# Patient Record
Sex: Male | Born: 1975
Health system: Southern US, Community
[De-identification: ages and names within clinical notes are randomized; demographics above are authoritative.]

## PROBLEM LIST (undated history)

## (undated) DIAGNOSIS — K589 Irritable bowel syndrome without diarrhea: Secondary | ICD-10-CM

## (undated) DIAGNOSIS — M545 Low back pain, unspecified: Secondary | ICD-10-CM

## (undated) DIAGNOSIS — K469 Unspecified abdominal hernia without obstruction or gangrene: Secondary | ICD-10-CM

## (undated) DIAGNOSIS — Z720 Tobacco use: Secondary | ICD-10-CM

## (undated) DIAGNOSIS — F959 Tic disorder, unspecified: Secondary | ICD-10-CM

## (undated) DIAGNOSIS — F419 Anxiety disorder, unspecified: Secondary | ICD-10-CM

## (undated) DIAGNOSIS — F329 Major depressive disorder, single episode, unspecified: Secondary | ICD-10-CM

## (undated) DIAGNOSIS — L409 Psoriasis, unspecified: Secondary | ICD-10-CM

## (undated) DIAGNOSIS — K219 Gastro-esophageal reflux disease without esophagitis: Secondary | ICD-10-CM

## (undated) DIAGNOSIS — F32A Depression, unspecified: Secondary | ICD-10-CM

## (undated) HISTORY — DX: Tobacco use: Z72.0

## (undated) HISTORY — DX: Psoriasis, unspecified: L40.9

## (undated) HISTORY — PX: EYE SURGERY: SHX253

## (undated) HISTORY — DX: Irritable bowel syndrome, unspecified: K58.9

## (undated) HISTORY — DX: Major depressive disorder, single episode, unspecified: F32.9

## (undated) HISTORY — DX: Tic disorder, unspecified: F95.9

## (undated) HISTORY — PX: COLONOSCOPY: SHX174

## (undated) HISTORY — DX: Low back pain: M54.5

## (undated) HISTORY — DX: Low back pain, unspecified: M54.50

## (undated) HISTORY — DX: Depression, unspecified: F32.A

## (undated) HISTORY — DX: Anxiety disorder, unspecified: F41.9

## (undated) HISTORY — DX: Unspecified abdominal hernia without obstruction or gangrene: K46.9

## (undated) HISTORY — DX: Gastro-esophageal reflux disease without esophagitis: K21.9

---

## 1997-08-17 ENCOUNTER — Ambulatory Visit (HOSPITAL_COMMUNITY): Admission: RE | Admit: 1997-08-17 | Discharge: 1997-08-17 | Payer: Self-pay | Admitting: Gastroenterology

## 1998-01-20 ENCOUNTER — Emergency Department (HOSPITAL_COMMUNITY): Admission: EM | Admit: 1998-01-20 | Discharge: 1998-01-20 | Payer: Self-pay | Admitting: Emergency Medicine

## 1998-04-25 ENCOUNTER — Encounter: Admission: RE | Admit: 1998-04-25 | Discharge: 1998-04-25 | Payer: Self-pay | Admitting: *Deleted

## 1998-10-19 ENCOUNTER — Emergency Department (HOSPITAL_COMMUNITY): Admission: EM | Admit: 1998-10-19 | Discharge: 1998-10-19 | Payer: Self-pay | Admitting: *Deleted

## 2000-05-22 ENCOUNTER — Emergency Department (HOSPITAL_COMMUNITY): Admission: EM | Admit: 2000-05-22 | Discharge: 2000-05-23 | Payer: Self-pay | Admitting: Emergency Medicine

## 2003-12-06 ENCOUNTER — Ambulatory Visit: Payer: Self-pay | Admitting: Internal Medicine

## 2005-01-29 ENCOUNTER — Ambulatory Visit: Payer: Self-pay | Admitting: Internal Medicine

## 2006-03-18 ENCOUNTER — Ambulatory Visit: Payer: Self-pay | Admitting: Internal Medicine

## 2006-04-20 ENCOUNTER — Encounter: Payer: Self-pay | Admitting: Internal Medicine

## 2008-01-21 HISTORY — PX: ESOPHAGOGASTRODUODENOSCOPY: SHX1529

## 2008-01-26 DIAGNOSIS — F341 Dysthymic disorder: Secondary | ICD-10-CM | POA: Insufficient documentation

## 2008-01-26 DIAGNOSIS — J309 Allergic rhinitis, unspecified: Secondary | ICD-10-CM | POA: Insufficient documentation

## 2008-01-28 ENCOUNTER — Ambulatory Visit: Payer: Self-pay | Admitting: Internal Medicine

## 2008-01-28 DIAGNOSIS — M542 Cervicalgia: Secondary | ICD-10-CM | POA: Insufficient documentation

## 2008-02-02 ENCOUNTER — Ambulatory Visit: Payer: Self-pay | Admitting: Internal Medicine

## 2008-02-02 ENCOUNTER — Encounter: Payer: Self-pay | Admitting: Internal Medicine

## 2008-10-27 ENCOUNTER — Encounter: Payer: Self-pay | Admitting: Internal Medicine

## 2009-12-12 ENCOUNTER — Emergency Department (HOSPITAL_COMMUNITY): Admission: EM | Admit: 2009-12-12 | Discharge: 2009-12-12 | Payer: Self-pay | Admitting: Emergency Medicine

## 2010-04-02 LAB — ETHANOL: Alcohol, Ethyl (B): 203 mg/dL — ABNORMAL HIGH (ref 0–10)

## 2010-06-07 NOTE — Assessment & Plan Note (Signed)
[facility]                         GASTROENTEROLOGY OFFICE NOTE   Clarence Mcgee, Clarence Mcgee                       MRN:          [id number]  DATE:03/18/2006                            DOB:          10/29/1975    CHIEF COMPLAINT:  Neck and throat pain, question reflux.   Clarence Mcgee is referred back by [facility] because of throat and neck  pain.   HISTORY:  The patient has a feeling like there is a tightness in his  throat, sometimes a burning or tearing pain.  He has really had a  problem with this for some time.  He relates the initial problem going  back to about 10 years ago when he ran into a fence while playing  softball and struck his neck.  He did have an ER evaluation at that  time, but cannot recall the details.  He feels like he has a sensitive  gag reflex.  There is no obvious dysphagia.  He feels like proton pump  inhibitors help this burning sensation, but do not completely relieve  it.  In the past, he had been on Nexium, but felt that that had made him  gain weight.  He does have chronic sinus and allergy problems, though he  feels like that has only been recently flaring up with drainage.  He is  having difficulty sleeping due to snoring.  He does work third shift.  Another problem is a chronic history of facial tics, and he says these  involve his neck.  When he was in the ninth grade, he went to a  neurologist and was given some medicine that made him sleepy.  He took  that for 2 days, and he never sought follow up.  He does not seem to  have other syndromes that go along with facial tics that I am aware of.  He is not losing weight.  He is still complaining about stretch marks  that were on his abdomen.  He is using some sort of cream that seems to  be helping those fade.  In the past, he had been on Celexa for the  pressor symptomatology.  He has stopped that.  He said that that made  concentration difficult.  Since his cousin moved  in with him and he has  somebody to talk to he says, he has less depression and anxiety  problems.   PAST MEDICAL HISTORY:  As noted above.  He has previously had an  endoscopy in [facility] a number of years ago that showed a hiatal  hernia.   MEDICATIONS:  Prevacid 30 mg daily.   DRUG ALLERGIES:  None known.   PHYSICAL EXAMINATION:  GENERAL:  A well-developed young white man in no  acute distress.  VITAL SIGNS:  Weight 206 pounds, pulse 68, blood pressure 118/70.  HEENT:  The eyes are anicteric.  He has mild erythema in the posterior  pharynx with some slight cobblestoning.  The tongue is normal.  The oral  cavity is otherwise normal.  NECK:  He has a mildly tender neck.  There is slight  fullness there.  I  think that is just the size of his neck.  I do not think he has  thyromegaly.  There is no significant pain upon rotation of the neck or  flexion or extension.   ASSESSMENT:  1. I think he probably does have some gastroesophageal reflux disease,      but I am not convinced it is responsible for all this neck and      throat pain.  That could be related to his tics.  It could be sinus      drainage.  It could be a combination of all three.  2. He is a smoker and is counseled to quit.  He said his tics      increased significantly when he tried to quit before.  3. Snoring.  Question sleep disturbance.  Question sleep apnea.   PLAN:  1. Trial of Prevacid twice daily.  I have explained that it will take      2-3 months to see if this will help these throat symptoms.  He will      return in 2 months.  I have given him samples to supplement his      daily prescription.  2. Gastroesophageal reflux disease.  Lifestyle diet sheet given.  3. He is counseled to quit smoking.  4. We will refer him to neurology for evaluation of his tics.  5. He may need an ear, nose and throat evaluation, as well.  Will see      how he responds to the Prevacid first.   NOTE:  I have reviewed [facility]'s office notes and records.  He had a  normal CMET and TSH on September 05, 2006.  He had a normal CBC on the same  date.     Iva Boop, MD,FACG  Electronically Signed    CEG/MedQ  DD: 03/18/2006  DT: 03/18/2006  Job #: [id number]   cc:   [facility]

## 2012-09-17 ENCOUNTER — Encounter: Payer: Self-pay | Admitting: Neurology

## 2012-09-17 ENCOUNTER — Ambulatory Visit (INDEPENDENT_AMBULATORY_CARE_PROVIDER_SITE_OTHER): Payer: BC Managed Care – PPO | Admitting: Neurology

## 2012-09-17 VITALS — BP 103/62 | HR 69 | Ht 67.0 in | Wt 198.0 lb

## 2012-09-17 DIAGNOSIS — Z720 Tobacco use: Secondary | ICD-10-CM

## 2012-09-17 DIAGNOSIS — F959 Tic disorder, unspecified: Secondary | ICD-10-CM | POA: Insufficient documentation

## 2012-09-17 DIAGNOSIS — M545 Low back pain, unspecified: Secondary | ICD-10-CM

## 2012-09-17 DIAGNOSIS — G2569 Other tics of organic origin: Secondary | ICD-10-CM

## 2012-09-17 DIAGNOSIS — F341 Dysthymic disorder: Secondary | ICD-10-CM

## 2012-09-17 DIAGNOSIS — K469 Unspecified abdominal hernia without obstruction or gangrene: Secondary | ICD-10-CM

## 2012-09-17 MED ORDER — CLONAZEPAM 0.5 MG PO TABS
0.5000 mg | ORAL_TABLET | Freq: Two times a day (BID) | ORAL | Status: DC | PRN
Start: 2012-09-17 — End: 2017-09-10

## 2012-09-17 NOTE — Progress Notes (Signed)
[facility]  PATIENT: Clarence Mcgee DOB: [birth year]  HISTORICAL Clarence Mcgee is a 37 years old right-handed Caucasian male, referred by his primary care Marisue Brooklyn for evaluation of tic disorder,  He had a history of tic disorder as long as he could remember, over the years, it has changed its pattern, he had frequent eye blinking, later on jerking movement of his neck, sometimes squeezing his throat muscles, to the point of he felt fatigued in his neck, he was seen by our clinic in 2010, he also has depression anxiety, social phobia, he has tried different medications in the past, including Paxil, Celexa, Lexapro, Prozac, which has caused worsening anxiety, he responded very well to low-dose clonazepam 0.5 mg twice a day, however due to difficulty getting the refill, he has stopped the clonazepam about 4 years ago, there was no significant withdrawal  He is smoking half pack to one pack a day, working as a Geologist, engineering, he has mild OCD tendency, such as cleaning his time clock 3 times, before he key in, he also continued to have mild to moderate tics, adjusting his pants repetitively, eye blinking, squeeze his neck muscles, he continued to suffer social anxiety, he even feels nervous when talking with a stranger on the phone, he wants to quite smoking, but his anxiety associated with it, has made his symptoms much worse, he wants to go back to clonazepam again.   REVIEW OF SYSTEMS: Full 14 system review of systems performed and notable only for snoring, depression,  ALLERGIES: No Known Allergies  HOME MEDICATIONS: Nexium  PAST MEDICAL HISTORY: Past Medical History  Diagnosis Date  . GERD (gastroesophageal reflux disease)   . Low back pain   . Hernia   . Tobacco use   . Tic     PAST SURGICAL HISTORY: Past Surgical History  Procedure Laterality Date  . None      FAMILY HISTORY: Family History  Problem Relation Age of Onset  . Diabetes Mother   .  Fibromyalgia Mother   . Diabetes Father     SOCIAL HISTORY:  History   Social History  . Marital Status: Divorced    Spouse Name: N/A    Number of Children: 2  . 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 Smoking status: Current Every Day Smoker -- 1.00 packs/day for 10 years    Types: Cigarettes  . Smokeless tobacco: Never Used  . Alcohol Use: 0.6 oz/week    1 Cans of beer per week     Comment: Sometimes  . Drug Use: No  . Sexual Activity: Not on file    Social History Narrative   Patient lives at home alone. Patient works at [employer] full time. Patient has some college education.   Caffeine- two daily   Right handed.     PHYSICAL EXAM  Filed Vitals:   09/17/12 0814  BP: 103/62  Pulse: 69  Height: 5\' 7"  (1.702 m)  Weight: 198 lb (89.812 kg)    Body mass index is 31 kg/(m^2).   Generalized: In no acute distress  Neck: Supple, no carotid bruits   Cardiac: Regular rate rhythm  Pulmonary: Clear to auscultation bilaterally  Musculoskeletal: No deformity  Neurological examination  Mentation: anxious looking young male, alert oriented to time, place, history taking, and causual conversation  Cranial nerve II-XII: Pupils were equal round reactive to  light extraocular movements were full, visual field were full on confrontational test. facial sensation and strength were normal. hearing was intact to finger rubbing bilaterally. Uvula tongue midline.  head turning and shoulder shrug and were normal and symmetric.Tongue protrusion into cheek strength was normal.  Motor: normal tone, bulk and strength.  Sensory: Intact to fine touch, pinprick, preserved vibratory sensation, and proprioception at toes.  Coordination: Normal finger to nose, heel-to-shin bilaterally there was no truncal ataxia  Gait: Rising up from seated position without assistance, normal stance, without  trunk ataxia, moderate stride, good arm swing, smooth turning, able to perform tiptoe, and heel walking without difficulty.   Romberg signs: Negative  Deep tendon reflexes: Brachioradialis 2/2, biceps 2/2, triceps 2/2, patellar 2/2, Achilles 2/2, plantar responses were flexor bilaterally.   DIAGNOSTIC DATA (LABS, IMAGING, TESTING) - I reviewed patient records, labs, notes, testing and imaging myself where available.    ASSESSMENT AND PLAN 37 years old right-handed Caucasian male, with history of tic disorder, failed multiple SSRI in the past, responded very well to low-dose clonazepam,  1 I will reveal clonazepam 0 point 5 mg twice a day 2. return to clinic in 5 months with Eber Jones.  Levert Feinstein, M.D. Ph.D.  [facility] [address] [place], [place] [address] [phone]

## 2013-01-11 ENCOUNTER — Emergency Department (HOSPITAL_BASED_OUTPATIENT_CLINIC_OR_DEPARTMENT_OTHER): Payer: 59

## 2013-01-11 ENCOUNTER — Emergency Department (HOSPITAL_BASED_OUTPATIENT_CLINIC_OR_DEPARTMENT_OTHER)
Admission: EM | Admit: 2013-01-11 | Discharge: 2013-01-11 | Disposition: A | Discharge status: Home / Self Care | Payer: 59 | Attending: Emergency Medicine | Admitting: Emergency Medicine

## 2013-01-11 ENCOUNTER — Encounter (HOSPITAL_BASED_OUTPATIENT_CLINIC_OR_DEPARTMENT_OTHER): Payer: Self-pay | Admitting: Emergency Medicine

## 2013-01-11 DIAGNOSIS — K219 Gastro-esophageal reflux disease without esophagitis: Secondary | ICD-10-CM | POA: Insufficient documentation

## 2013-01-11 DIAGNOSIS — R0602 Shortness of breath: Secondary | ICD-10-CM | POA: Insufficient documentation

## 2013-01-11 DIAGNOSIS — F17200 Nicotine dependence, unspecified, uncomplicated: Secondary | ICD-10-CM | POA: Insufficient documentation

## 2013-01-11 DIAGNOSIS — Z79899 Other long term (current) drug therapy: Secondary | ICD-10-CM | POA: Insufficient documentation

## 2013-01-11 DIAGNOSIS — IMO0001 Reserved for inherently not codable concepts without codable children: Secondary | ICD-10-CM | POA: Insufficient documentation

## 2013-01-11 DIAGNOSIS — B9789 Other viral agents as the cause of diseases classified elsewhere: Secondary | ICD-10-CM | POA: Insufficient documentation

## 2013-01-11 DIAGNOSIS — B349 Viral infection, unspecified: Secondary | ICD-10-CM

## 2013-01-11 DIAGNOSIS — R51 Headache: Secondary | ICD-10-CM | POA: Insufficient documentation

## 2013-01-11 MED ORDER — ONDANSETRON 4 MG PO TBDP
4.0000 mg | ORAL_TABLET | Freq: Once | ORAL | Status: DC
  Filled 2013-01-11: qty 1

## 2013-01-11 MED ORDER — BENZONATATE 100 MG PO CAPS: 100.0000 mg | ORAL_CAPSULE | Freq: Three times a day (TID) | ORAL | Status: DC

## 2013-01-11 MED ORDER — ONDANSETRON 4 MG PO TBDP
4.0000 mg | ORAL_TABLET | Freq: Once | ORAL | Status: AC
  Administered 2013-01-11: 4 mg via ORAL

## 2013-01-11 MED ORDER — ACETAMINOPHEN 325 MG PO TABS
650.0000 mg | ORAL_TABLET | Freq: Once | ORAL | Status: AC
  Administered 2013-01-11: 650 mg via ORAL
  Filled 2013-01-11: qty 2

## 2013-01-11 MED ORDER — IBUPROFEN 800 MG PO TABS: 800.0000 mg | ORAL_TABLET | Freq: Three times a day (TID) | ORAL | Status: DC

## 2013-01-11 NOTE — ED Notes (Signed)
Pt reports 4 days ago with cough, congestion and sinus pressure.  Fever.  Today with vomiting, cough induced.

## 2013-01-11 NOTE — ED Provider Notes (Signed)
CSN: [id number]     Arrival date & time 01/11/13  1651 History   None    Chief Complaint  Patient presents with  . Fever   (Consider location/radiation/quality/duration/timing/severity/associated sxs/prior Treatment) Patient is a 37 y.o. male presenting with cough. The history is provided by the patient. No language interpreter was used.  Cough Cough characteristics:  Productive Severity:  Moderate Onset quality:  Gradual Duration:  3 days Timing:  Constant Progression:  Worsening Chronicity:  New Smoker: no   Context: sick contacts   Relieved by:  Nothing Worsened by:  Nothing tried Ineffective treatments:  None tried Associated symptoms: chills, fever, headaches, myalgias, shortness of breath, sinus congestion and sore throat     Past Medical History  Diagnosis Date  . GERD (gastroesophageal reflux disease)   . Low back pain   . Hernia   . Tobacco use   . Tic    Past Surgical History  Procedure Laterality Date  . None     Family History  Problem Relation Age of Onset  . Diabetes Mother   . Fibromyalgia Mother   . Diabetes Father    History  Substance Use Topics  . Smoking status: Current Every Day Smoker -- 1.00 packs/day for 10 years    Types: Cigarettes  . Smokeless tobacco: Never Used  . Alcohol Use: 0.6 oz/week    1 Cans of beer per week     Comment: Sometimes    Review of Systems  Constitutional: Positive for fever and chills.  HENT: Positive for sore throat.   Respiratory: Positive for cough and shortness of breath.   Musculoskeletal: Positive for myalgias.  Neurological: Positive for headaches.  All other systems reviewed and are negative.    Allergies  Review of patient's allergies indicates no known allergies.  Home Medications   Current Outpatient Rx  Name  Route  Sig  Dispense  Refill  . clobetasol cream (TEMOVATE) 0.05 %      0.05 application as directed.         . clonazePAM (KLONOPIN) 0.5 MG tablet   Oral   Take 1 tablet  (0.5 mg total) by mouth 2 (two) times daily as needed for anxiety.   60 tablet   5   . NEXIUM 40 MG capsule      40 mg daily.          BP 115/63  Pulse 94  Temp(Src) 100 F (37.8 C) (Oral)  Resp 22  Ht 5' 8.5" (1.74 m)  Wt 200 lb (90.719 kg)  BMI 29.96 kg/m2  SpO2 98% Physical Exam  Nursing note and vitals reviewed. Constitutional: He appears well-developed and well-nourished.  HENT:  Head: Normocephalic and atraumatic.  Eyes: Conjunctivae and EOM are normal. Pupils are equal, round, and reactive to light.  Neck: Normal range of motion. Neck supple.  Cardiovascular: Normal rate.   Pulmonary/Chest: Effort normal and breath sounds normal. He has no wheezes. He has no rales. He exhibits no tenderness.  Abdominal: Soft.  Musculoskeletal: Normal range of motion.  Neurological: He is alert.  Skin: Skin is warm.    ED Course  Procedures (including critical care time) Labs Review Labs Reviewed - No data to display Imaging Review No results found.  EKG Interpretation   None       MDM   1. Viral illness    Chest xray no pneumonia.  Pt given tylenol.  zofran  Po fluids encouraged.  Pt given rx for ibuprofen and tessalon  Lonia Skinner [facility], PA-C 01/11/13 2313

## 2013-01-11 NOTE — ED Notes (Signed)
Pt complains of body aches and fever.  Pt reports cough that caused him to vomit today.

## 2013-01-12 NOTE — ED Provider Notes (Signed)
Medical screening examination/treatment/procedure(s) were performed by non-physician practitioner and as supervising physician I was immediately available for consultation/collaboration.  EKG Interpretation   None         Junius Argyle, MD 01/12/13 1045

## 2013-02-17 ENCOUNTER — Ambulatory Visit: Payer: BC Managed Care – PPO | Admitting: Nurse Practitioner

## 2014-06-20 ENCOUNTER — Encounter: Payer: Self-pay | Admitting: Internal Medicine

## 2014-07-18 ENCOUNTER — Emergency Department (HOSPITAL_COMMUNITY): Payer: BLUE CROSS/BLUE SHIELD

## 2014-07-18 ENCOUNTER — Encounter (HOSPITAL_COMMUNITY): Payer: Self-pay | Admitting: Emergency Medicine

## 2014-07-18 ENCOUNTER — Emergency Department (HOSPITAL_COMMUNITY)
Admission: EM | Admit: 2014-07-18 | Discharge: 2014-07-18 | Disposition: A | Discharge status: Home / Self Care | Payer: BLUE CROSS/BLUE SHIELD | Attending: Emergency Medicine | Admitting: Emergency Medicine

## 2014-07-18 DIAGNOSIS — K219 Gastro-esophageal reflux disease without esophagitis: Secondary | ICD-10-CM | POA: Diagnosis not present

## 2014-07-18 DIAGNOSIS — Z791 Long term (current) use of non-steroidal anti-inflammatories (NSAID): Secondary | ICD-10-CM | POA: Diagnosis not present

## 2014-07-18 DIAGNOSIS — R112 Nausea with vomiting, unspecified: Secondary | ICD-10-CM | POA: Insufficient documentation

## 2014-07-18 DIAGNOSIS — J029 Acute pharyngitis, unspecified: Secondary | ICD-10-CM | POA: Diagnosis not present

## 2014-07-18 DIAGNOSIS — Z720 Tobacco use: Secondary | ICD-10-CM | POA: Insufficient documentation

## 2014-07-18 DIAGNOSIS — R509 Fever, unspecified: Secondary | ICD-10-CM | POA: Diagnosis present

## 2014-07-18 DIAGNOSIS — M791 Myalgia: Secondary | ICD-10-CM | POA: Diagnosis not present

## 2014-07-18 DIAGNOSIS — R05 Cough: Secondary | ICD-10-CM | POA: Insufficient documentation

## 2014-07-18 DIAGNOSIS — R5383 Other fatigue: Secondary | ICD-10-CM | POA: Insufficient documentation

## 2014-07-18 DIAGNOSIS — R059 Cough, unspecified: Secondary | ICD-10-CM

## 2014-07-18 LAB — RAPID STREP SCREEN (MED CTR MEBANE ONLY): Streptococcus, Group A Screen (Direct): POSITIVE — AB

## 2014-07-18 MED ORDER — PENICILLIN G BENZATHINE 1200000 UNIT/2ML IM SUSP
2.4000 10*6.[IU] | Freq: Once | INTRAMUSCULAR | Status: AC
  Administered 2014-07-18: 2.4 10*6.[IU] via INTRAMUSCULAR
  Filled 2014-07-18: qty 4

## 2014-07-18 MED ORDER — ONDANSETRON 4 MG PO TBDP: 4.0000 mg | ORAL_TABLET | Freq: Three times a day (TID) | ORAL | Status: DC | PRN

## 2014-07-18 MED ORDER — ONDANSETRON 4 MG PO TBDP
4.0000 mg | ORAL_TABLET | Freq: Once | ORAL | Status: AC
  Administered 2014-07-18: 4 mg via ORAL
  Filled 2014-07-18: qty 1

## 2014-07-18 NOTE — ED Provider Notes (Signed)
CSN: [id number]     Arrival date & time 07/18/14  0712 History   First MD Initiated Contact with Patient 07/18/14 0719     Chief Complaint  Patient presents with  . Fever      HPI  Condition returns for evaluation of fever sore throat and body aches. Symptoms last 24 hours. Taking Mucinex at home for fever. Has not taken Motrin follow-up. Vomiting this morning 2. Presents here.  Nonproductive cough. No urinary symptoms. No diarrhea.  Past Medical History  Diagnosis Date  . GERD (gastroesophageal reflux disease)   . Low back pain   . Hernia   . Tobacco use   . Tic    Past Surgical History  Procedure Laterality Date  . None     Family History  Problem Relation Age of Onset  . Diabetes Mother   . Fibromyalgia Mother   . Diabetes Father    History  Substance Use Topics  . Smoking status: Current Every Day Smoker -- 1.00 packs/day for 10 years    Types: Cigarettes  . Smokeless tobacco: Never Used  . Alcohol Use: 0.6 oz/week    1 Cans of beer per week     Comment: Sometimes    Review of Systems  Constitutional: Positive for fever and fatigue. Negative for chills, diaphoresis and appetite change.  HENT: Positive for sore throat. Negative for mouth sores and trouble swallowing.   Eyes: Negative for visual disturbance.  Respiratory: Positive for cough. Negative for chest tightness, shortness of breath and wheezing.   Cardiovascular: Negative for chest pain.  Gastrointestinal: Positive for nausea and vomiting. Negative for abdominal pain, diarrhea and abdominal distention.  Endocrine: Negative for polydipsia, polyphagia and polyuria.  Genitourinary: Negative for dysuria, frequency and hematuria.  Musculoskeletal: Positive for myalgias. Negative for gait problem.  Skin: Negative for color change, pallor and rash.  Neurological: Negative for dizziness, syncope, light-headedness and headaches.  Hematological: Does not bruise/bleed easily.  Psychiatric/Behavioral: Negative  for behavioral problems and confusion.      Allergies  Review of patient's allergies indicates no known allergies.  Home Medications   Prior to Admission medications   Medication Sig Start Date End Date Taking? Authorizing Provider  benzonatate (TESSALON) 100 MG capsule Take 1 capsule (100 mg total) by mouth every 8 (eight) hours. 01/11/13   Elson AreasLeslie K Sofia, PA-C  clobetasol cream (TEMOVATE) 0.05 % 0.05 application as directed. 08/25/12   Historical Provider, MD  clonazePAM (KLONOPIN) 0.5 MG tablet Take 1 tablet (0.5 mg total) by mouth 2 (two) times daily as needed for anxiety. 09/17/12   Levert FeinsteinYijun Yan, MD  ibuprofen (ADVIL,MOTRIN) 800 MG tablet Take 1 tablet (800 mg total) by mouth 3 (three) times daily. 01/11/13   Lonia SkinnerLeslie K Sofia, PA-C  NEXIUM 40 MG capsule 40 mg daily. 08/25/12   Historical Provider, MD  ondansetron (ZOFRAN ODT) 4 MG disintegrating tablet Take 1 tablet (4 mg total) by mouth every 8 (eight) hours as needed for nausea. 07/18/14   Rolland PorterMark Maggy Wyble, MD   BP 124/76 mmHg  Pulse 100  Temp(Src) 99.2 F (37.3 C) (Oral)  Resp 20  SpO2 96% Physical Exam  Constitutional: He is oriented to person, place, and time. He appears well-developed and well-nourished. No distress.  HENT:  Head: Macrocephalic.  Erythematous pharynx. Not classic appearance for strep no exudate no petechiae.  Eyes: Conjunctivae are normal. Pupils are equal, round, and reactive to light. No scleral icterus.  Neck: Normal range of motion. Neck supple. No thyromegaly present.  No adenopathy of the neck. .  Cardiovascular: Normal rate and regular rhythm.  Exam reveals no gallop and no friction rub.   No murmur heard. Pulmonary/Chest: Effort normal and breath sounds normal. No respiratory distress. He has no wheezes. He has no rales.  Clear lungs. No adventitial breath sounds.  Abdominal: Soft. Bowel sounds are normal. He exhibits no distension. There is no tenderness. There is no rebound.  Musculoskeletal: Normal range  of motion.  Neurological: He is alert and oriented to person, place, and time.  Skin: Skin is warm and dry. No rash noted.  Psychiatric: He has a normal mood and affect. His behavior is normal.    ED Course  Procedures (including critical care time) Labs Review Labs Reviewed  RAPID STREP SCREEN (NOT AT [facility]) - Abnormal; Notable for the following:    Streptococcus, Group A Screen (Direct) POSITIVE (*)    All other components within normal limits    Imaging Review Dg Chest 2 View  07/18/2014   CLINICAL DATA:  Cough.  EXAM: CHEST  2 VIEW  COMPARISON:  January 11, 2013.  FINDINGS: The heart size and mediastinal contours are within normal limits. Both lungs are clear. No pneumothorax or pleural effusion is noted. The visualized skeletal structures are unremarkable.  IMPRESSION: No active cardiopulmonary disease.   Electronically Signed   By: Lupita Raider, M.D.   On: 07/18/2014 07:55     EKG Interpretation None      MDM   Final diagnoses:  Cough  Pharyngitis    Causes strep. Given IM Bicillin. By mouth Zofran. Taking by mouth liquids. Plan is home, rest, push fluids, Motrin or Tylenol when necessary.    Rolland Porter, MD 07/18/14 [phone]

## 2014-07-18 NOTE — ED Notes (Addendum)
Pt reports taking mucinex at 0600; denies taking anything else for symptoms.

## 2014-07-18 NOTE — Discharge Instructions (Signed)
Zofran as needed for nausea or vomiting. Rest, push fluids. Return or Tylenol as needed for fever or body aches.   Fever, Adult A fever is a temperature of 100.4 F (38 C) or above.  HOME CARE  Take fever medicine as told by your doctor. Do not  take aspirin for fever if you are younger than 41 years of age.  If you are given antibiotic medicine, take it as told. Finish the medicine even if you start to feel better.  Rest.  Drink enough fluids to keep your pee (urine) clear or pale yellow. Do not drink alcohol.  Take a bath or shower with room temperature water. Do not use ice water or alcohol sponge baths.  Wear lightweight, loose clothes. GET HELP RIGHT AWAY IF:   You are short of breath or have trouble breathing.  You are very weak.  You are dizzy or you pass out (faint).  You are very thirsty or are making little or no urine.  You have new pain.  You throw up (vomit) or have watery poop (diarrhea).  You keep throwing up or having watery poop for more than 1 to 2 days.  You have a stiff neck or light bothers your eyes.  You have a skin rash.  You have a fever or problems (symptoms) that last for more than 2 to 3 days.  You have a fever and your problems quickly get worse.  You keep throwing up the fluids you drink.  You do not feel better after 3 days.  You have new problems. MAKE SURE YOU:   Understand these instructions.  Will watch your condition.  Will get help right away if you are not doing well or get worse. Document Released: 10/16/2007 Document Revised: 03/31/2011 Document Reviewed: 11/07/2010 [facility] EdwardsExitCare Patient Information 2015 [place], [place]. This information is not intended to replace advice given to you by your health care provider. Make sure you discuss any questions you have with your health care provider.  Pharyngitis Pharyngitis is a sore throat (pharynx). There is redness, pain, and swelling of your throat. HOME CARE   Drink enough  fluids to keep your pee (urine) clear or pale yellow.  Only take medicine as told by your doctor.  You may get sick again if you do not take medicine as told. Finish your medicines, even if you start to feel better.  Do not take aspirin.  Rest.  Rinse your mouth (gargle) with salt water ( tsp of salt per 1 qt of water) every 1-2 hours. This will help the pain.  If you are not at risk for choking, you can suck on hard candy or sore throat lozenges. GET HELP IF:  You have large, tender lumps on your neck.  You have a rash.  You cough up green, yellow-brown, or bloody spit. GET HELP RIGHT AWAY IF:   You have a stiff neck.  You drool or cannot swallow liquids.  You throw up (vomit) or are not able to keep medicine or liquids down.  You have very bad pain that does not go away with medicine.  You have problems breathing (not from a stuffy nose). MAKE SURE YOU:   Understand these instructions.  Will watch your condition.  Will get help right away if you are not doing well or get worse. Document Released: 06/25/2007 Document Revised: 10/27/2012 Document Reviewed: 09/13/2012 [facility]Care Patient Information 2015 [place], [place]. This information is not intended to replace advice given to you by your health care  provider. Make sure you discuss any questions you have with your health care provider. ° °

## 2014-07-18 NOTE — ED Notes (Signed)
Pt transported to DG at present time; will administer nausea medication with pt return.

## 2014-07-18 NOTE — ED Notes (Signed)
Per pt, states fever and cold symptoms for 2 days-no relief with OTC meds

## 2015-04-21 IMAGING — CR DG CHEST 2V
2 series · 2 of 2 positions shown · non-contrast
Comparison: None.

CLINICAL DATA: Cough and fever.

EXAM:
CHEST  2 VIEW

[w chest pa]
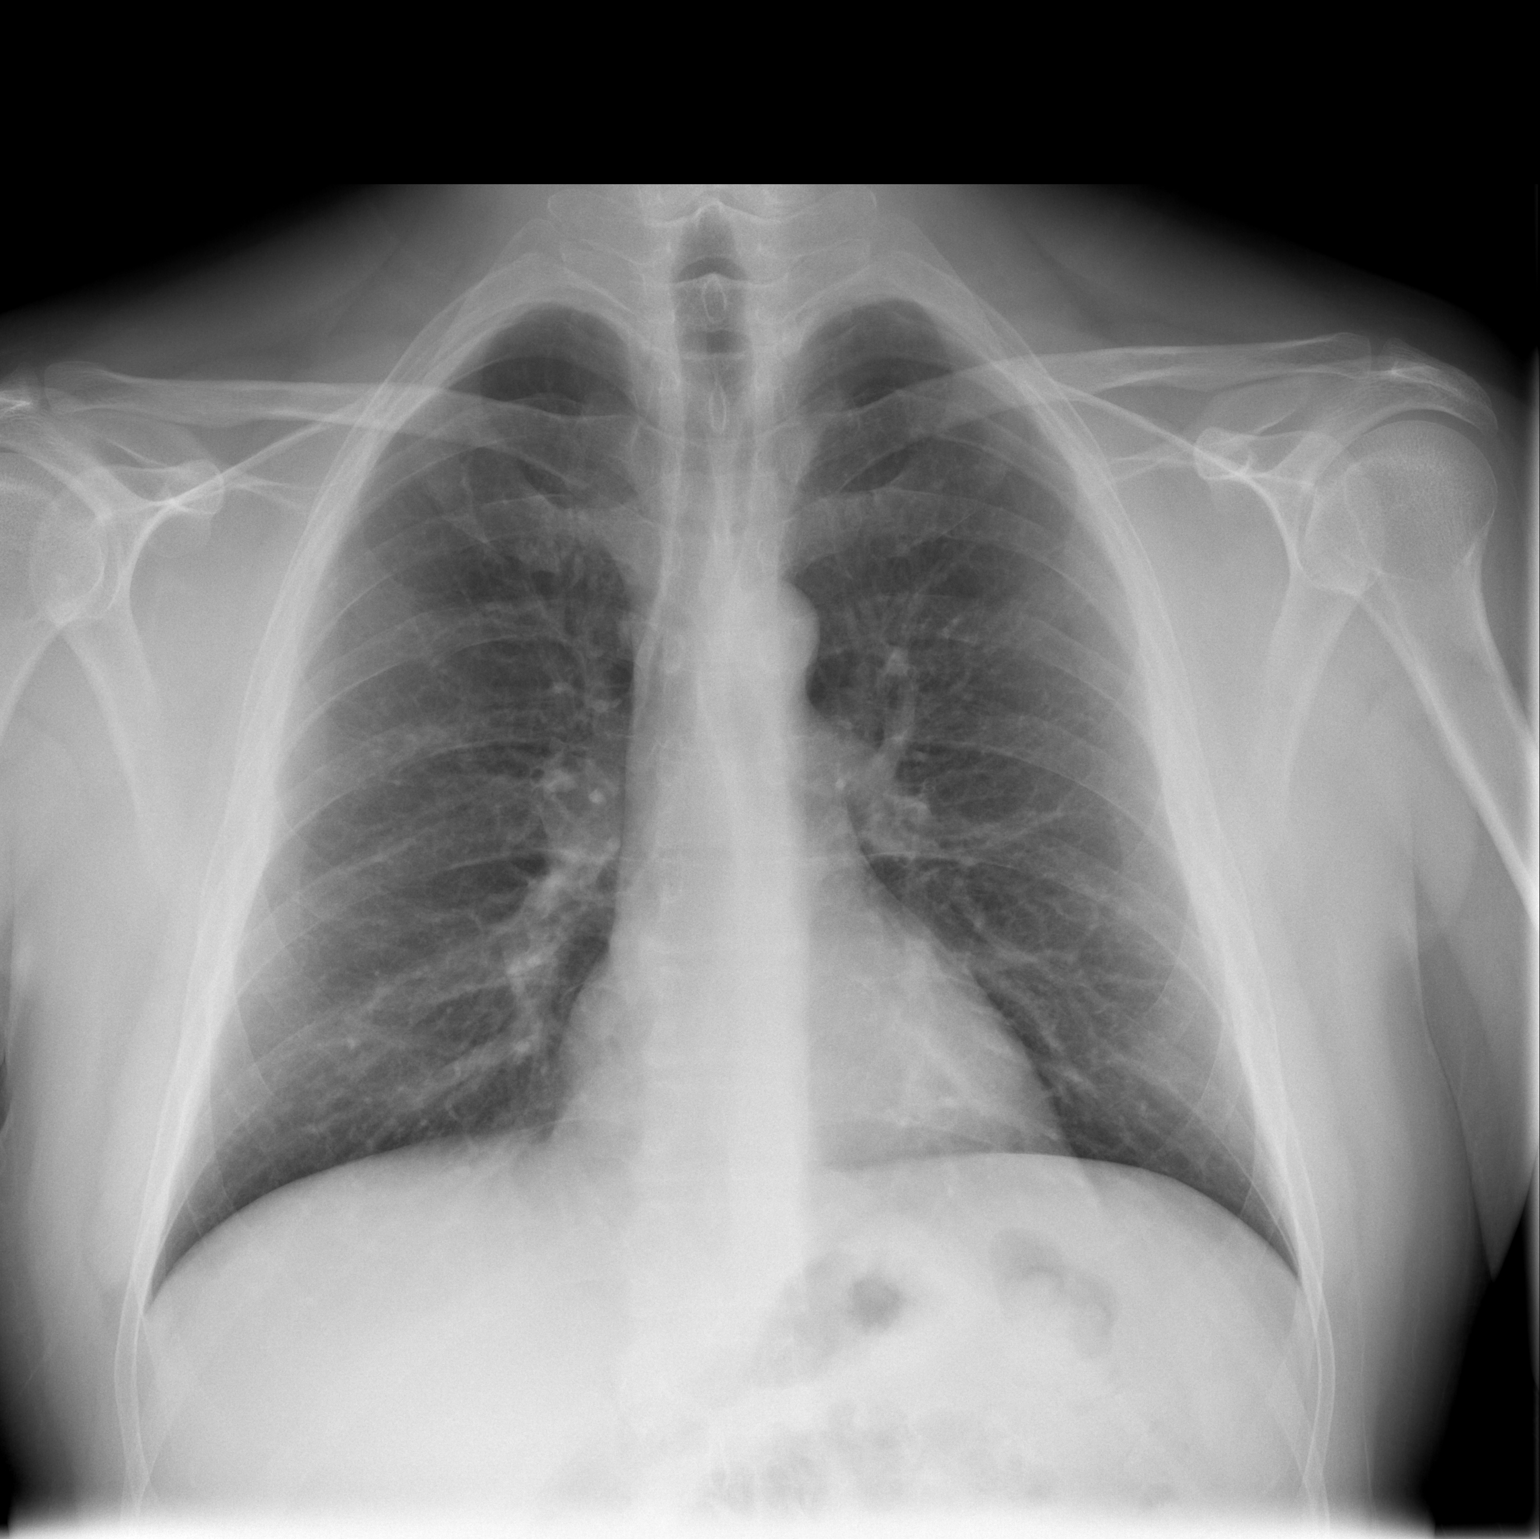

[w chest lat]
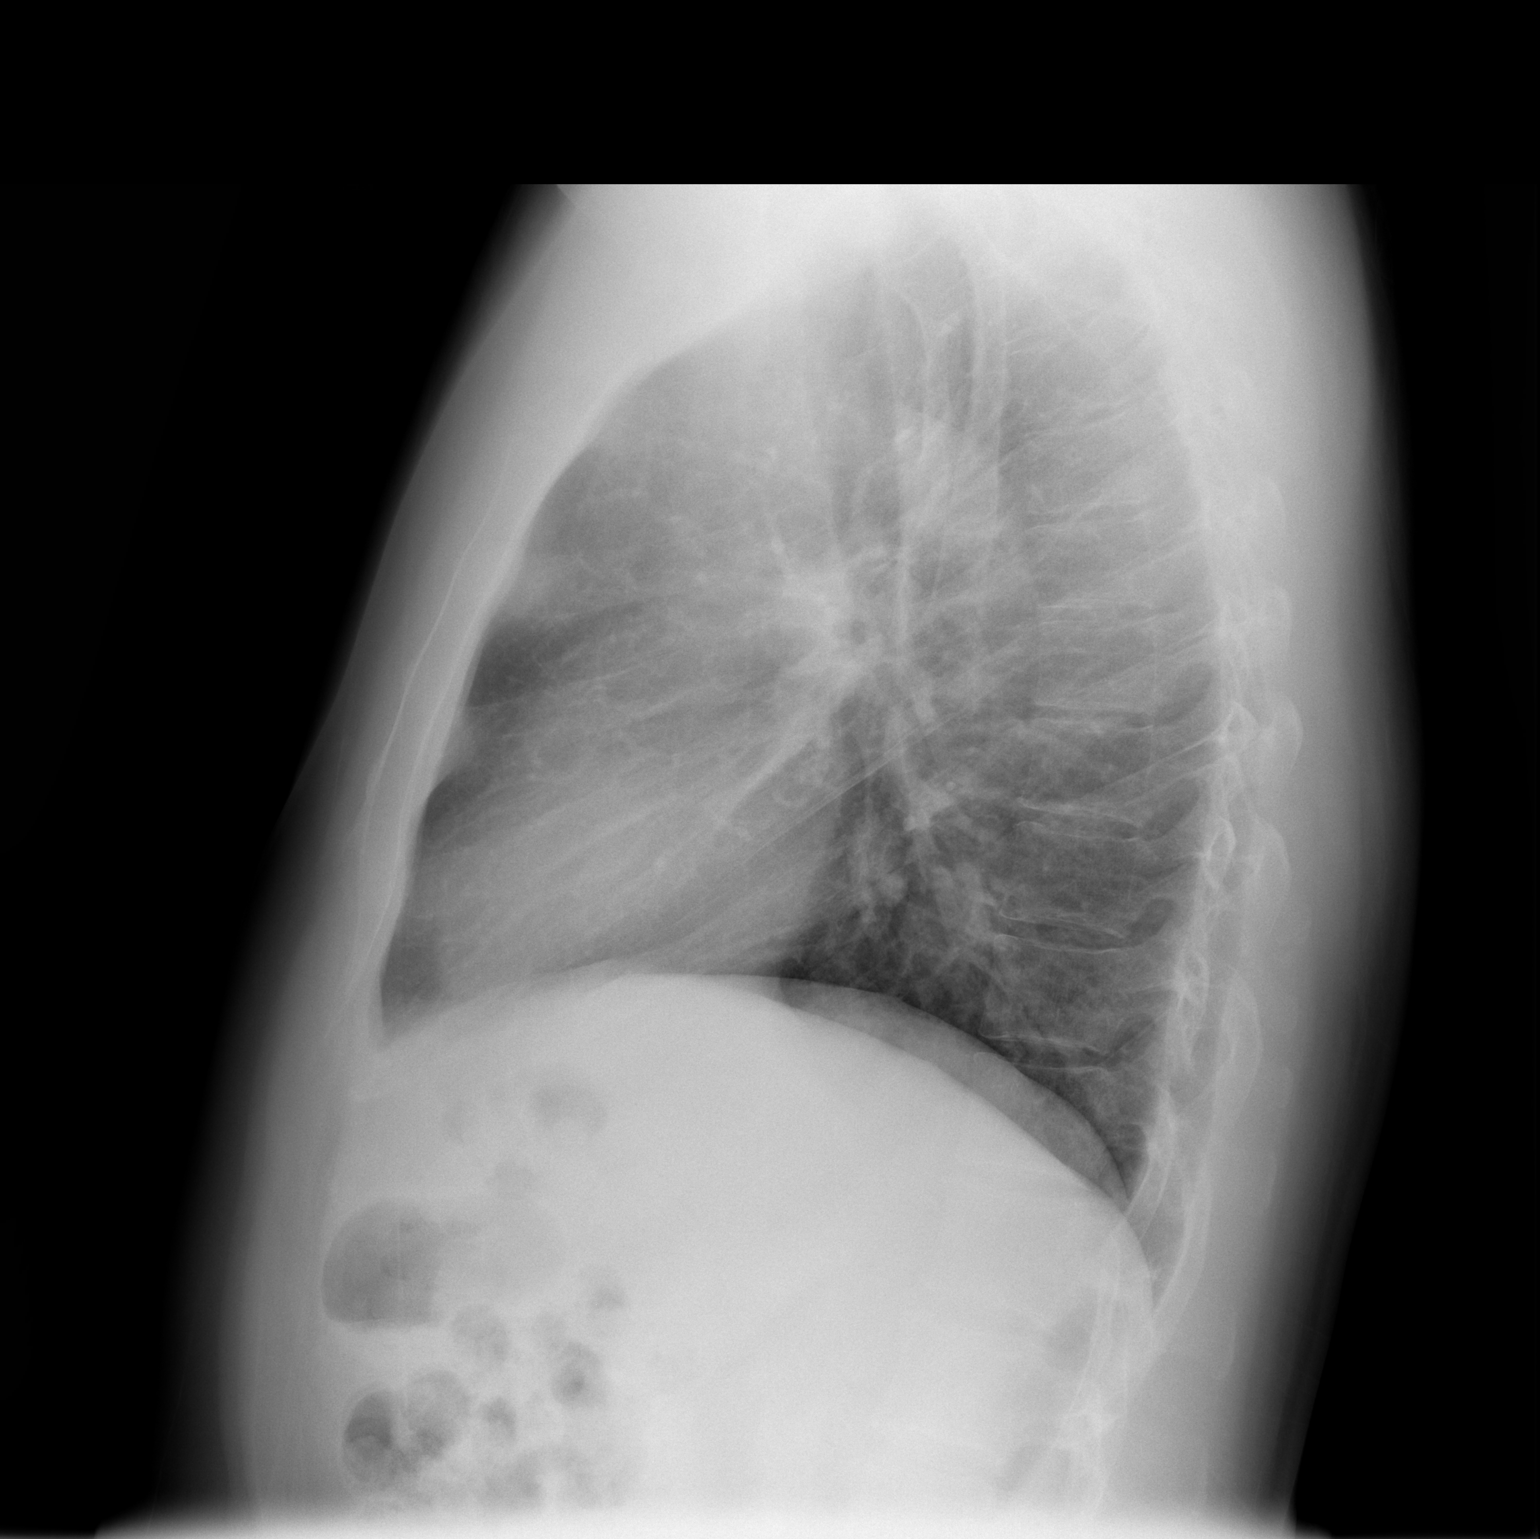

[2 of 2 positions shown; findings below may reference images not displayed]

FINDINGS: The lungs are well-aerated and clear. There is no evidence of focal
opacification, pleural effusion or pneumothorax. An apparent
left-sided nipple shadow is seen.

The heart is normal in size; the mediastinal contour is within
normal limits. No acute osseous abnormalities are seen.
IMPRESSION: No acute cardiopulmonary process seen.

## 2017-03-30 ENCOUNTER — Other Ambulatory Visit: Payer: Self-pay | Admitting: Family Medicine

## 2017-03-30 ENCOUNTER — Ambulatory Visit
Admission: RE | Admit: 2017-03-30 | Discharge: 2017-03-30 | Disposition: A | Discharge status: Home / Self Care | Payer: BLUE CROSS/BLUE SHIELD | Source: Ambulatory Visit | Attending: Family Medicine | Admitting: Family Medicine

## 2017-03-30 DIAGNOSIS — R140 Abdominal distension (gaseous): Secondary | ICD-10-CM

## 2017-03-30 DIAGNOSIS — K5901 Slow transit constipation: Secondary | ICD-10-CM

## 2017-07-06 ENCOUNTER — Encounter: Payer: Self-pay | Admitting: Internal Medicine

## 2017-09-10 ENCOUNTER — Encounter: Payer: Self-pay | Admitting: Internal Medicine

## 2017-09-10 ENCOUNTER — Encounter (INDEPENDENT_AMBULATORY_CARE_PROVIDER_SITE_OTHER): Payer: Self-pay

## 2017-09-10 ENCOUNTER — Ambulatory Visit: Payer: BLUE CROSS/BLUE SHIELD | Admitting: Internal Medicine

## 2017-09-10 VITALS — BP 120/80 | HR 72 | Ht 67.75 in | Wt 223.2 lb

## 2017-09-10 DIAGNOSIS — K2100 Gastro-esophageal reflux disease with esophagitis, without bleeding: Secondary | ICD-10-CM

## 2017-09-10 DIAGNOSIS — K581 Irritable bowel syndrome with constipation: Secondary | ICD-10-CM | POA: Diagnosis not present

## 2017-09-10 DIAGNOSIS — R635 Abnormal weight gain: Secondary | ICD-10-CM

## 2017-09-10 DIAGNOSIS — Z6834 Body mass index (BMI) 34.0-34.9, adult: Secondary | ICD-10-CM

## 2017-09-10 DIAGNOSIS — E6609 Other obesity due to excess calories: Secondary | ICD-10-CM

## 2017-09-10 DIAGNOSIS — L409 Psoriasis, unspecified: Secondary | ICD-10-CM | POA: Insufficient documentation

## 2017-09-10 MED ORDER — ESOMEPRAZOLE MAGNESIUM 40 MG PO CPDR: 40.0000 mg | DELAYED_RELEASE_CAPSULE | Freq: Every day | ORAL | 3 refills | Status: DC

## 2017-09-10 NOTE — Progress Notes (Signed)
Clarence Mcgee 42 y.o. 10-14-1975 [id number]  Assessment & Plan:   Encounter Diagnoses  Name Primary?  . GERD with esophagitis Yes  . Irritable bowel syndrome with constipation   . Weight gain   . Class 1 obesity due to excess calories without serious comorbidity with body mass index (BMI) of 34.0 to 34.9 in adult     Refill generic Nexium 40 mg daily.  Work on weight loss.  Advised to look at the diet doctor website and to try to restrict carbohydrates and consider intermittent fasting or at least reducing the amount of time in the day that he eats.  Avoid late night snacking which he says he is "bad at".  Smoking cessation is appropriate also.  GERD diet recommended.  IBS is stabilized and may have been associated or related to situational stress.  Return to see me in a year sooner as needed.  I appreciate the opportunity to care for this patient. CC: Scifres, Dorothy, PA-C  Subjective:   Chief Complaint: Reflux abdominal pain constipation  HPI The patient presents, I last saw him in 2010 or 2011, he had an EGD with erosive esophagitis and a 3 cm hiatal hernia, he is complaining of recurrent heartburn problems and changes in bowel habits and lower abdominal pain.  In February and March he was going through a stressful time, family member died, his brother moved in with him he got off of night shift after gaining 20 pounds on that and went back to days and started having GI symptoms.  He had been on 20 mg of Nexium generic but was having heartburn and indigestion then and may he was changed to 40 mg and as long as he takes that his symptoms are under control.  No dysphagia.  He was also having crampy lower abdominal pain and constipation was seen and evaluated with a negative x-ray and laboratory testing with a normal hemoglobin normal electrolytes and kidney function in March.  At that time he was treated with MiraLAX acutely and some dicyclomine intermittently and over time his  bowel habits have normalized there is not been any bleeding and he reports that now the only thing remaining is some urgency to his defecation but no incontinence or diarrhea.  He feels like he is lost a few pounds.  He is trying to come up with a plan to lose weight.  He was diagnosed with psoriasis which is treated with topicals and is not much of a problem since I saw him last and he wonders if there is any association there.  He had a ruptured globe on the left due to a metal band striking him at work in 2016 but with suturing his vision and globe was saved.  He is noticing some presbyopia symptoms now. Allergies  Allergen Reactions  . Moxifloxacin Rash   Current Meds  Medication Sig  . dicyclomine (BENTYL) 20 MG tablet Take 1 tablet by mouth as needed.  Marland Kitchen esomeprazole (NEXIUM) 40 MG capsule Take 1 capsule (40 mg total) by mouth daily before breakfast. 30 mins before  . [DISCONTINUED] NEXIUM 40 MG capsule Take 40 mg by mouth daily.    Past Medical History:  Diagnosis Date  . Anxiety   . Depression   . GERD (gastroesophageal reflux disease)   . Hernia   . IBS (irritable bowel syndrome)   . Low back pain   . Psoriasis   . Tic   . Tobacco use    Past Surgical  History:  Procedure Laterality Date  . ESOPHAGOGASTRODUODENOSCOPY  2010  . EYE SURGERY Left    ruptured globe   Social History   Social History Narrative   Patient lives at home alone. 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   Divorced   Current smoker   Caffeine- two daily   Right handed.   2 sons adults as of 2019   No alcohol no smokeless tobacco or drug use   family history includes Diabetes in his father and mother; Fibromyalgia in his mother; Heart attack in his maternal grandfather; Kidney disease in his mother.   Review of Systems As per HPI.  Depressed mood at times anxious and anxious mood at times allergies.  All other review of systems are  negative or as per HPI.  Objective:   Physical Exam @BP  120/80 (BP Location: Left Arm, Patient Position: Sitting, Cuff Size: Normal)   Pulse 72   Ht 5' 7.75" (1.721 m) Comment: height measured without shoes  Wt 223 lb 4 oz (101.3 kg)   BMI 34.20 kg/m @  General:  Well-developed, well-nourished and in no acute distress overweight to obese. Eyes:  anicteric. ENT:   Mouth and posterior pharynx free of lesions.  Neck:   supple w/o thyromegaly or mass.  Lungs: Clear to auscultation bilaterally. Heart:  S1S2, no rubs, murmurs, gallops. Abdomen:  soft, non-tender, no hepatosplenomegaly, hernia, or mass and BS+.  Mild striae present Rectal: Mild increase in anal tone no mass normal squeeze appropriate abdominal contraction and relaxation and descent with simulated defecation Lymph:  no cervical or supraclavicular adenopathy. Extremities:   no edema, cyanosis or clubbing Neuro:  A&O x 3.  Psych:  appropriate mood and  Affect.   Data Reviewed:  I have reviewed labs using the point of care recommendation reports applied by [facility].

## 2017-09-10 NOTE — Patient Instructions (Signed)
  Please check out the Diet Doctor Web-site for weight loss information.   We have sent the following medications to your pharmacy for you to pick up at your convenience: Nexium    I appreciate the opportunity to care for you. Stan Headarl Gessner, MD, [facility]

## 2017-12-10 DIAGNOSIS — Z0000 Encounter for general adult medical examination without abnormal findings: Secondary | ICD-10-CM | POA: Diagnosis not present

## 2017-12-10 DIAGNOSIS — F959 Tic disorder, unspecified: Secondary | ICD-10-CM | POA: Diagnosis not present

## 2017-12-10 DIAGNOSIS — E785 Hyperlipidemia, unspecified: Secondary | ICD-10-CM | POA: Diagnosis not present

## 2017-12-10 DIAGNOSIS — E559 Vitamin D deficiency, unspecified: Secondary | ICD-10-CM | POA: Diagnosis not present

## 2017-12-10 DIAGNOSIS — Z23 Encounter for immunization: Secondary | ICD-10-CM | POA: Diagnosis not present

## 2017-12-15 ENCOUNTER — Encounter: Payer: Self-pay | Admitting: *Deleted

## 2017-12-21 ENCOUNTER — Encounter: Payer: Self-pay | Admitting: Neurology

## 2017-12-21 ENCOUNTER — Ambulatory Visit: Payer: 59 | Admitting: Neurology

## 2017-12-21 VITALS — BP 147/85 | HR 102 | Ht 67.75 in | Wt 227.0 lb

## 2017-12-21 DIAGNOSIS — F959 Tic disorder, unspecified: Secondary | ICD-10-CM | POA: Diagnosis not present

## 2017-12-21 DIAGNOSIS — F418 Other specified anxiety disorders: Secondary | ICD-10-CM

## 2017-12-21 MED ORDER — TOPIRAMATE 100 MG PO TABS: 100.0000 mg | ORAL_TABLET | Freq: Two times a day (BID) | ORAL | 11 refills | Status: DC

## 2017-12-21 NOTE — Progress Notes (Signed)
PATIENT: Clarence Mcgee Stlouis DOB: 07-28-1975  Chief Complaint  Patient presents with  . Tic disorder    Last seen 09/17/12.  Reports various tics: blinking, neck jerking, twisting his pants, constantly placing the neckline of his shirt in his mouth.  He also finds himself doing multiple tasks three times each.  Says his anxiety is much worse.  He was previously on clonazepam 0.5mg , one tablet BID which was helpful.  Marland Kitchen. PCP    Scifres, Nicole Cellaorothy, PA-C     HISTORICAL  Clarence Mcgee Headlee is a 42 year old male, seen in request by his primary care PA Scifres, Nicole CellaDorothy for evaluation of tic disorder, initial evaluation was on December 21, 2017.  I have reviewed and summarized the referring note from the referring physician.  He has past medical history of hyperlipidemia, GERD, depression, I saw him in August 2014 for similar complaints.  Reported history of motor tics since childhood, as long as he could remember, initially it was forceful eye blinking at 42 years old, later developed vocal tics, tends to clear his throat, over the years, he was also diagnosed with depression, anxiety, social phobia, was seen by psychiatrist, tried different medications in the past, including Prozac, Lexapro, Celexa, Paxil, without helping his symptoms, sometimes causing worsening anxiety, for a while, he was treated with clonazepam 0.5 mg twice daily responded very well, I gave him clonazepam prescription in 2014, but he has lost follow-up.  He works at [employer] job, for a while he worked at night shift from 5 PM to 4 AM, reported significant weight gain, also complains of worsening depression anxiety, has lost interest, "does not want to do anything", he also smokes at least half pack a day,  He has OCD tendencies, tends to close his computer screen 3 times each time when he touch it, continue has motor tics, adjusting his parents repetitively, biting on his T-shirts, squeezing his neck muscles,   REVIEW OF SYSTEMS:  Full 14 system review of systems performed and notable only for weight gain, fatigue, snoring, depression, anxiety, too much sleep, decreased energy, rash, itching All other review of systems were negative.  ALLERGIES: Allergies  Allergen Reactions  . Moxifloxacin Rash    HOME MEDICATIONS: Current Outpatient Medications  Medication Sig Dispense Refill  . atorvastatin (LIPITOR) 10 MG tablet Take 10 mg by mouth daily.    Marland Kitchen. esomeprazole (NEXIUM) 20 MG capsule Take 20 mg by mouth daily at 12 noon.    Marland Kitchen. VITAMIN D PO Take 5,000 Units by mouth daily.     No current facility-administered medications for this visit.     PAST MEDICAL HISTORY: Past Medical History:  Diagnosis Date  . Anxiety   . Depression   . GERD (gastroesophageal reflux disease)   . Hernia   . IBS (irritable bowel syndrome)   . Low back pain   . Psoriasis   . Tic   . Tobacco use     PAST SURGICAL HISTORY: Past Surgical History:  Procedure Laterality Date  . ESOPHAGOGASTRODUODENOSCOPY  2010  . EYE SURGERY Left    ruptured globe    FAMILY HISTORY: Family History  Problem Relation Age of Onset  . Diabetes Mother   . Fibromyalgia Mother   . Kidney disease Mother   . Kidney cancer Mother   . Diabetes Father   . Heart attack Maternal Grandfather   . Lupus Sister   . Stroke Maternal Grandmother     SOCIAL HISTORY: Social History   Socioeconomic  History  . Marital status: Divorced    Spouse name: Not on file  . Number of children: 2  . Years of education: some college  . Highest education level: Not on file  Occupational History  . Occupation: Location manager    Comment: [employer]  Social Needs  . Financial resource strain: Not on file  . Food insecurity:    Worry: Not on file    Inability: Not on file  . Transportation needs:    Medical: Not on file    Non-medical: Not on file  Tobacco Use  . Smoking status: Current Every Day Smoker    Packs/day: 0.50    Years: 10.00    Pack years:  5.00    Types: Cigarettes  . Smokeless tobacco: Never Used  Substance and Sexual Activity  . Alcohol use: Yes    Comment: 2-4 beers per week  . Drug use: No  . Sexual activity: Not on file  Lifestyle  . Physical activity:    Days per week: Not on file    Minutes per session: Not on file  . Stress: Not on file  Relationships  . Social connections:    Talks on phone: Not on file    Gets together: Not on file    Attends religious service: Not on file    Active member of club or organization: Not on file    Attends meetings of clubs or organizations: Not on file    Relationship status: Not on file  . Intimate partner violence:    Fear of current or ex partner: Not on file    Emotionally abused: Not on file    Physically abused: Not on file    Forced sexual activity: Not on file  Other Topics Concern  . Not on file  Social History Narrative   Patient lives at home with his brother. 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   Divorced   Current smoker   Caffeine use: 2-3 cups daily   Right handed   2 sons adults as of 2019     PHYSICAL EXAM   Vitals:   12/21/17 1358  BP: (!) 147/85  Pulse: (!) 102  Weight: 227 lb (103 kg)  Height: 5' 7.75" (1.721 m)    Not recorded      Body mass index is 34.77 kg/m.  PHYSICAL EXAMNIATION:  Gen: NAD, conversant, well nourised, obese, well groomed                     Cardiovascular: Regular rate rhythm, no peripheral edema, warm, nontender. Eyes: Conjunctivae clear without exudates or hemorrhage Neck: Supple, no carotid bruits. Pulmonary: Clear to auscultation bilaterally   NEUROLOGICAL EXAM:  MENTAL STATUS: Speech:    Speech is normal; fluent and spontaneous with normal comprehension.  Cognition:     Orientation to time, place and person     Normal recent and remote memory     Normal Attention span and concentration     Normal Language, naming,  repeating,spontaneous speech     Fund of knowledge   CRANIAL NERVES: CN II: Visual fields are full to confrontation.  Pupils are round equal and briskly reactive to light. CN III, IV, VI: extraocular movement are normal. No ptosis. CN V: Facial sensation is intact to pinprick in all 3 divisions bilaterally. Corneal responses are intact.  CN VII: Face is symmetric with  normal eye closure and smile. CN VIII: Hearing is normal to rubbing fingers CN IX, X: Palate elevates symmetrically. Phonation is normal. CN XI: Head turning and shoulder shrug are intact CN XII: Tongue is midline with normal movements and no atrophy.  MOTOR: There is no pronator drift of out-stretched arms. Muscle bulk and tone are normal. Muscle strength is normal.  REFLEXES: Reflexes are 2+ and symmetric at the biceps, triceps, knees, and ankles. Plantar responses are flexor.  SENSORY: Intact to light touch, pinprick, positional sensation and vibratory sensation are intact in fingers and toes.  COORDINATION: Rapid alternating movements and fine finger movements are intact. There is no dysmetria on finger-to-nose and heel-knee-shin.    GAIT/STANCE: Posture is normal. Gait is steady with normal steps, base, arm swing, and turning. Heel and toe walking are normal. Tandem gait is normal.  Romberg is absent.   DIAGNOSTIC DATA (LABS, IMAGING, TESTING) - I reviewed patient records, labs, notes, testing and imaging myself where available.   ASSESSMENT AND PLAN  AMARO MANGOLD is a 42 y.o. male   Tics Depression anxiety  Will try Topamax 100 mg twice a day  Refer him to psychiatrist  Levert Feinstein, M.D. Ph.D.  [facility] [address] [place], [place] [address] Ph: [phone] Fax: [phone]  CC: Scifres, Nicole Cella, [place]

## 2018-04-05 ENCOUNTER — Telehealth: Payer: Self-pay | Admitting: Neurology

## 2018-04-05 ENCOUNTER — Ambulatory Visit: Payer: 59 | Admitting: Neurology

## 2018-04-05 NOTE — Telephone Encounter (Signed)
Please call patient  I saw him for tic disorder, tried Topamax 100 mg twice a day in December 2019, please check on his response to Topamax, I also refer him to psychiatrist, if he has seen a psychiatrist already, he can continue to manage his tic disorder,

## 2018-04-05 NOTE — Telephone Encounter (Signed)
Left message requesting patient to call back

## 2018-04-06 NOTE — Telephone Encounter (Signed)
Spoke to patient.  He is doing well on Topamax 100mg , one tablet BID.  Due to his work hours, he has not been able to establish care with a psychiatrist yet.  He would like to follow up with Dr. Terrace Arabia at least once more.  He has been scheduled to see her on 06/17/2018.

## 2018-06-17 ENCOUNTER — Other Ambulatory Visit: Payer: Self-pay

## 2018-06-17 ENCOUNTER — Ambulatory Visit (INDEPENDENT_AMBULATORY_CARE_PROVIDER_SITE_OTHER): Payer: 59 | Admitting: Neurology

## 2018-06-17 DIAGNOSIS — F341 Dysthymic disorder: Secondary | ICD-10-CM

## 2018-06-17 DIAGNOSIS — F959 Tic disorder, unspecified: Secondary | ICD-10-CM | POA: Diagnosis not present

## 2018-06-17 MED ORDER — TOPIRAMATE 100 MG PO TABS: 100.0000 mg | ORAL_TABLET | Freq: Two times a day (BID) | ORAL | 4 refills | Status: DC

## 2018-06-17 MED ORDER — FLUOXETINE HCL 10 MG PO CAPS
20.0000 mg | ORAL_CAPSULE | Freq: Every day | ORAL | 11 refills | Status: DC
Start: 2018-06-17 — End: 2023-04-23

## 2018-06-17 NOTE — Progress Notes (Signed)
    PATIENT: Clarence Mcgee DOB: 1975/03/15  No chief complaint on file.    HISTORICAL  Clarence Mcgee is a 43 year old male, seen in request by his primary care PA Scifres, Nicole Cella for evaluation of tic disorder, initial evaluation was on December 21, 2017.  I have reviewed and summarized the referring note from the referring physician.  He has past medical history of hyperlipidemia, GERD, depression, I saw him in August 2014 for similar complaints.  Reported history of motor tics since childhood, as long as he could remember, initially it was forceful eye blinking at 43 years old, later developed vocal tics, tends to clear his throat, over the years, he was also diagnosed with depression, anxiety, social phobia, was seen by psychiatrist, tried different medications in the past, including Prozac, Lexapro, Celexa, Paxil, without helping his symptoms, sometimes causing worsening anxiety, for a while, he was treated with clonazepam 0.5 mg twice daily responded very well, I gave him clonazepam prescription in 2014, but he has lost follow-up.  He works at [employer] job, for a while he worked at night shift from 5 PM to 4 AM, reported significant weight gain, also complains of worsening depression anxiety, has lost interest, "does not want to do anything", he also smokes at least half pack a day,  He has OCD tendencies, tends to close his computer screen 3 times each time when he touch it, continue has motor tics, adjusting his pants repetitively, biting on his T-shirts, squeezing his neck muscles,  Virtual Visit via Video  I connected with Clarence Mcgee on 06/17/18 at  by Video and verified that I am speaking with the correct person using two identifiers.   I discussed the limitations, risks, security and privacy concerns of performing an evaluation and management service by video and the availability of in person appointments. I also discussed with the patient that there may be a patient  responsible charge related to this service. The patient expressed understanding and agreed to proceed.   History of Present Illness: His motor tic is much improved with Topamax 100 mg twice a day, initially he has mild word finding difficulties, but now has much improved, continue to experience some side effect of numbness tingling in his hands and feet,  He reported extreme stress, both of his parents and his sister are ill, complains of anxiety    Observations/Objective: I have reviewed problem lists, medications, allergies.  Awake alert oriented to history taking care of conversation, moving 4 extremities without difficulties  Assessment and Plan: Motor tics Anxiety  Continue Topamax 100 mg twice daily  Add on Prozac 10 mg daily  Follow Up Instructions:  6 months with Maralyn Sago     I discussed the assessment and treatment plan with the patient. The patient was provided an opportunity to ask questions and all were answered. The patient agreed with the plan and demonstrated an understanding of the instructions.   The patient was advised to call back or seek an in-person evaluation if the symptoms worsen or if the condition fails to improve as anticipated.  I provided 30 minutes of non-face-to-face time during this encounter.   Levert Feinstein, MD

## 2018-06-18 ENCOUNTER — Encounter: Payer: Self-pay | Admitting: Neurology

## 2019-04-29 LAB — HM COLONOSCOPY

## 2019-07-08 IMAGING — CR DG ABDOMEN 1V
1 series · 1 of 1 positions shown · non-contrast
Comparison: None.

CLINICAL DATA: Constipation for 2 months with abdominal pain

EXAM:
ABDOMEN - 1 VIEW

[t abdomen supine]
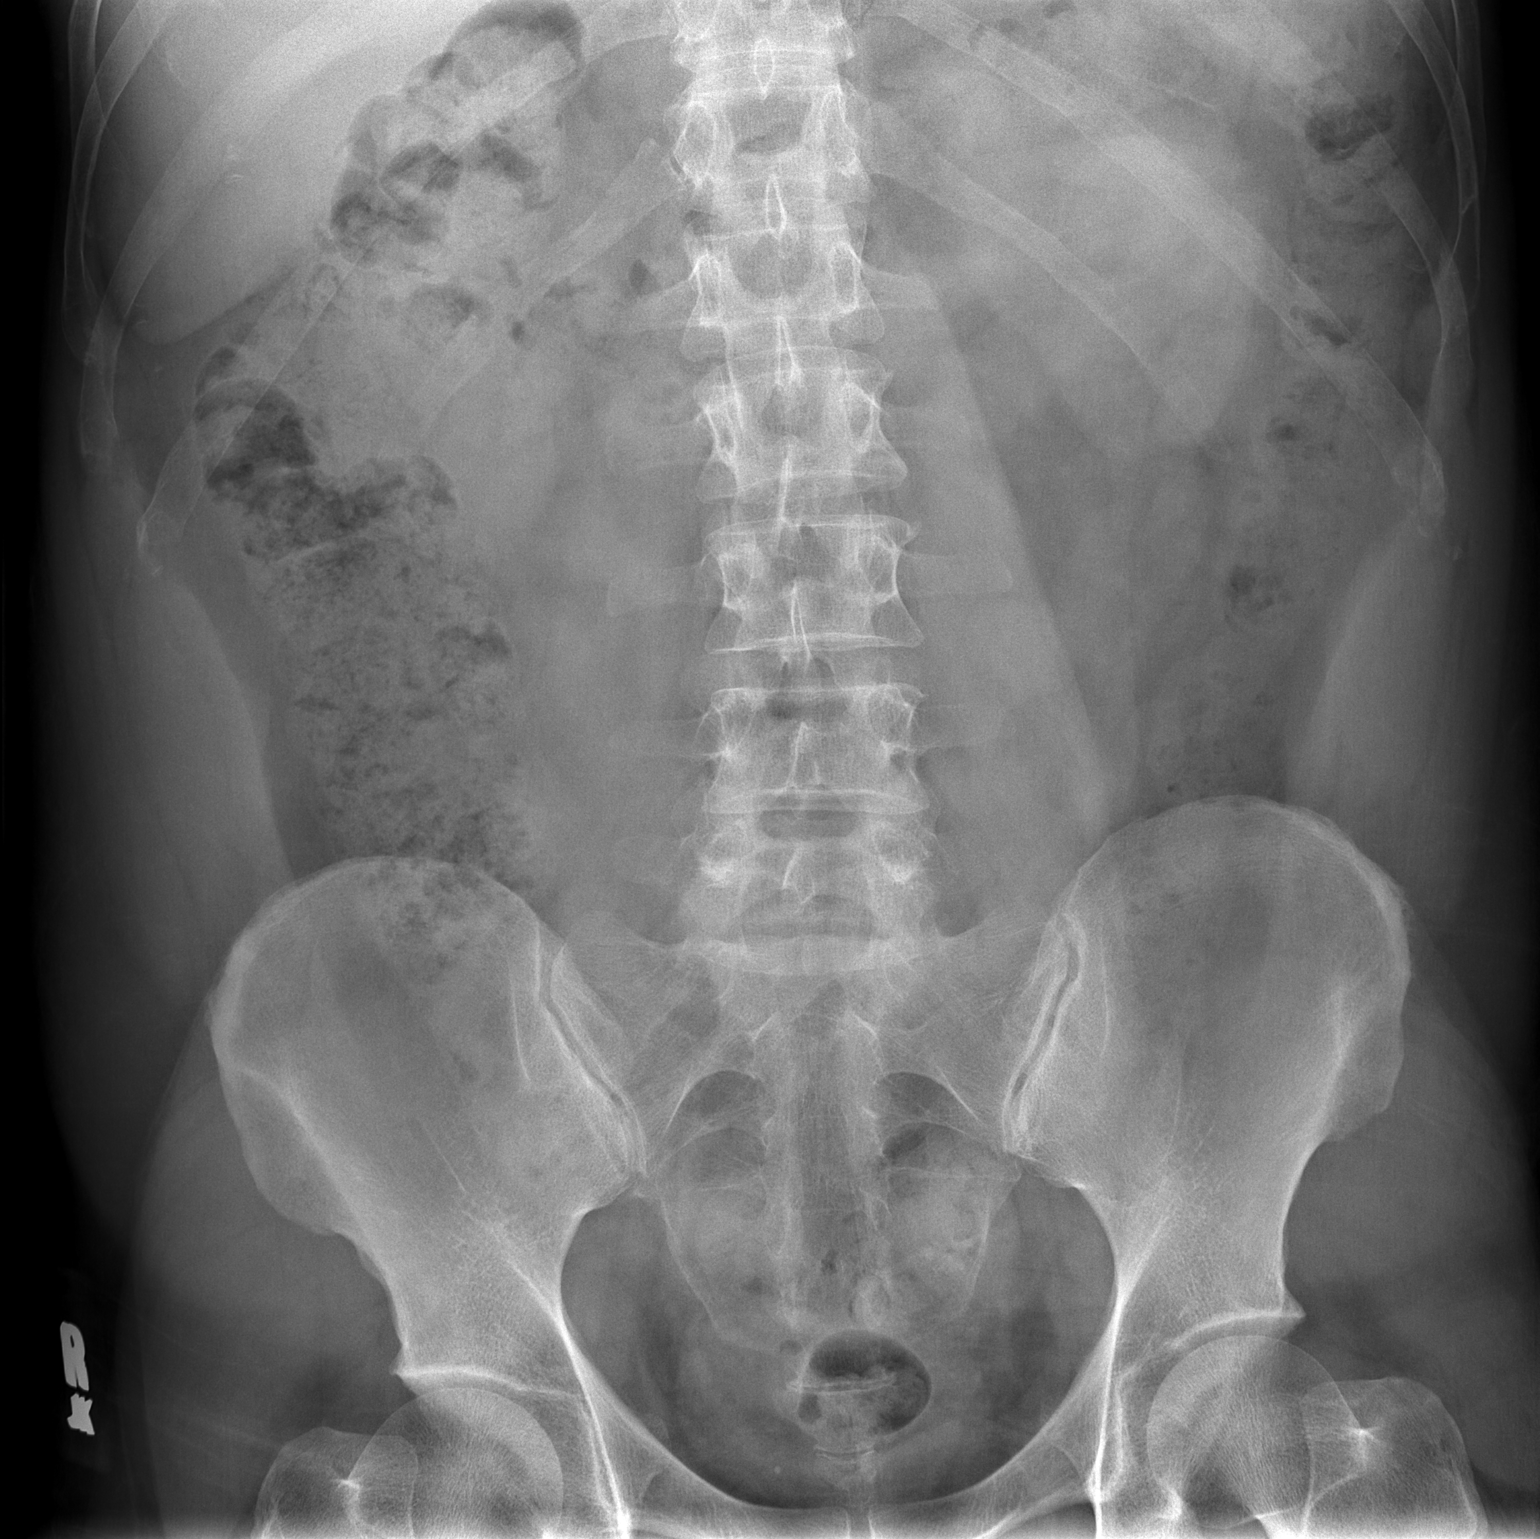

[1 of 1 positions shown; findings below may reference images not displayed]

FINDINGS: Scattered large and small bowel gas is noted. Mild retained fecal
material is noted consistent constipation. No abnormal mass or
abnormal calcifications are seen. No bony abnormality is noted.
IMPRESSION: Mild constipation.

## 2020-02-17 ENCOUNTER — Encounter: Payer: Managed Care, Other (non HMO) | Attending: Physician Assistant | Admitting: Registered"

## 2020-02-17 ENCOUNTER — Other Ambulatory Visit: Payer: Self-pay

## 2020-02-17 ENCOUNTER — Encounter: Payer: Self-pay | Admitting: Registered"

## 2020-02-17 DIAGNOSIS — E119 Type 2 diabetes mellitus without complications: Secondary | ICD-10-CM | POA: Insufficient documentation

## 2020-02-17 DIAGNOSIS — E1169 Type 2 diabetes mellitus with other specified complication: Secondary | ICD-10-CM | POA: Diagnosis not present

## 2020-02-17 NOTE — Progress Notes (Signed)
Diabetes Self-Management Education  Visit Type: First/Initial  Appt. Start Time: 0800 Appt. End Time: 0910  02/17/2020  Mr. Clarence Mcgee, identified by name and date of birth, is a 45 y.o. male with a diagnosis of Diabetes: Type 2.   ASSESSMENT  There were no vitals taken for this visit. There is no height or weight on file to calculate BMI.   Patient states he has made some changes since his blood sugar levels had increased. Pt states giving up soda is difficult for him but he has reduced it some. Pt states he enjoys cooking and has a lot of spices at home. Pt states he like most vegetables. Pt reports he was working 4/11 hr days but lately has been working a lot of over-time and has less time for exercise.    Pt states he is a little confused by the weight gain over the last 2 years because he has been active but has put on 30-40 lbs.  Stress: Patient states he has had a lot of stress for the last couple of years. Pt states both parents had been experiencing health issues and his dad died last year, mother is sick and has diabetes with blood sugars reading so high that the meter won't register and he is worried and frustrated she won't take better care of herself. Pt states she stays with him and people bring over sweets.   Pt states he will get an updated A1c in March to see if medication is needed and will contact [facility] if he feels a follow-up visit will help.   Diabetes Self-Management Education - 02/17/20 0755      Visit Information   Visit Type First/Initial      Initial Visit   Diabetes Type Type 2    Are you taking your medications as prescribed? Not on Medications    Date Diagnosed nov 2021      Health Coping   How would you rate your overall health? Good      Psychosocial Assessment   Patient Belief/Attitude about Diabetes Other (comment)   motivated and afraid   How often do you need to have someone help you when you read instructions, pamphlets, or other written  materials from your doctor or pharmacy? 1 - Never    What is the last grade level you completed in school? some college      Complications   Last HgB A1C per patient/outside source 6.8 %    How often do you check your blood sugar? 1-2 times/day    Fasting Blood glucose range (mg/dL) 70-017    Postprandial Blood glucose range (mg/dL) --   494-496   Number of hypoglycemic episodes per month 0    Number of hyperglycemic episodes per week 0    Have you had a dilated eye exam in the past 12 months? Yes    Have you had a dental exam in the past 12 months? Yes    Are you checking your feet? Yes    How many days per week are you checking your feet? 3      Dietary Intake   Breakfast skips OR eggs OR glucerna    Snack (morning) 9 am chips or nuts    Lunch subway with flat bread    Snack (afternoon) none    Dinner protein, vegetables OR 1x week spaghetti    Snack (evening) none    Beverage(s) water, few sodas, sweet tea up to 3 x per week, occasional alcohol  last 6 months 12-pack beer      Exercise   Exercise Type Light (walking / raking leaves)    How many days per week to you exercise? 2    How many minutes per day do you exercise? 60    Total minutes per week of exercise 120      Patient Education   Previous Diabetes Education No    Nutrition management  Role of diet in the treatment of diabetes and the relationship between the three main macronutrients and blood glucose level;Food label reading, portion sizes and measuring food.    Physical activity and exercise  Role of exercise on diabetes management, blood pressure control and cardiac health.    Monitoring Identified appropriate SMBG and/or A1C goals.    Psychosocial adjustment Role of stress on diabetes      Outcomes   Expected Outcomes Demonstrated interest in learning. Expect positive outcomes    Future DMSE PRN    Program Status Completed           Individualized Plan for Diabetes Self-Management Training:   Learning  Objective:  Patient will have a greater understanding of diabetes self-management. Patient education plan is to attend individual and/or group sessions per assessed needs and concerns.    Patient Instructions  App for your meter is MySugr that will synce your blood sugar readings  Next time you are on your way to get a soda, pay attention to why and think of other ways to satisfy the desire to drink soda.  Continue to include vegetables in your daily diet. Frozen vegetables can be as good of choice as fresh. Beans are great to include in your diet.  Taco idea mix 1/2 & 1/2 beef, smoked paprika for flavor. Consider adding beans in a lot of different dishes.  Look for way to help reduce your stress. Meditation, find a hobby you enjoy, exercise 3-5 times per week   Expected Outcomes:  Demonstrated interest in learning. Expect positive outcomes  Education material provided: ADA - How to Thrive: A Guide for Your Journey with Diabetes  If problems or questions, patient to contact team via:  Phone and MyChart  Future DSME appointment: PRN

## 2020-02-17 NOTE — Patient Instructions (Addendum)
App for your meter is MySugr that will synce your blood sugar readings  Next time you are on your way to get a soda, pay attention to why and think of other ways to satisfy the desire to drink soda.  Continue to include vegetables in your daily diet. Frozen vegetables can be as good of choice as fresh. Beans are great to include in your diet.  Taco idea mix 1/2 & 1/2 beef, smoked paprika for flavor. Consider adding beans in a lot of different dishes.  Look for way to help reduce your stress. Meditation, find a hobby you enjoy, exercise 3-5 times per week

## 2021-09-10 ENCOUNTER — Other Ambulatory Visit: Payer: Self-pay | Admitting: Physician Assistant

## 2021-09-10 ENCOUNTER — Ambulatory Visit
Admission: RE | Admit: 2021-09-10 | Discharge: 2021-09-10 | Disposition: A | Discharge status: Home / Self Care | Payer: Managed Care, Other (non HMO) | Source: Ambulatory Visit | Attending: Physician Assistant | Admitting: Physician Assistant

## 2021-09-10 DIAGNOSIS — R1031 Right lower quadrant pain: Secondary | ICD-10-CM

## 2021-09-10 DIAGNOSIS — R110 Nausea: Secondary | ICD-10-CM

## 2023-03-11 ENCOUNTER — Encounter: Payer: Self-pay | Admitting: Physician Assistant

## 2023-04-23 ENCOUNTER — Ambulatory Visit: Payer: Managed Care, Other (non HMO) | Admitting: Physician Assistant

## 2023-04-23 ENCOUNTER — Encounter: Payer: Self-pay | Admitting: Physician Assistant

## 2023-04-23 VITALS — BP 136/78 | HR 78 | Ht 67.75 in | Wt 223.2 lb

## 2023-04-23 DIAGNOSIS — L290 Pruritus ani: Secondary | ICD-10-CM

## 2023-04-23 DIAGNOSIS — Z800 Family history of malignant neoplasm of digestive organs: Secondary | ICD-10-CM

## 2023-04-23 DIAGNOSIS — R1084 Generalized abdominal pain: Secondary | ICD-10-CM | POA: Diagnosis not present

## 2023-04-23 DIAGNOSIS — K625 Hemorrhage of anus and rectum: Secondary | ICD-10-CM

## 2023-04-23 DIAGNOSIS — R109 Unspecified abdominal pain: Secondary | ICD-10-CM

## 2023-04-23 DIAGNOSIS — K640 First degree hemorrhoids: Secondary | ICD-10-CM

## 2023-04-23 MED ORDER — NA SULFATE-K SULFATE-MG SULF 17.5-3.13-1.6 GM/177ML PO SOLN: 1.0000 | Freq: Once | ORAL | 0 refills | Status: AC

## 2023-04-23 NOTE — Patient Instructions (Addendum)
 Please purchase the following medications over the counter and take as directed: A&D ointment  We have sent the following medications to your pharmacy for you to pick up at your convenience: Suprep: follow the directions on your instructions given by your CMA.  You have been scheduled for a colonoscopy. Please follow written instructions given to you at your visit today.   If you use inhalers (even only as needed), please bring them with you on the day of your procedure.  DO NOT TAKE 7 DAYS PRIOR TO TEST- Trulicity (dulaglutide) Ozempic, Wegovy (semaglutide) Mounjaro (tirzepatide) Bydureon Bcise (exanatide extended release)  DO NOT TAKE 1 DAY PRIOR TO YOUR TEST Rybelsus (semaglutide) Adlyxin (lixisenatide) Victoza (liraglutide) Byetta (exanatide) ___________________________________________________________________________ Due to recent changes in healthcare laws, you may see the results of your imaging and laboratory studies on MyChart before your provider has had a chance to review them.  We understand that in some cases there may be results that are confusing or concerning to you. Not all laboratory results come back in the same time frame and the provider may be waiting for multiple results in order to interpret others.  Please give [place] 48 hours in order for your provider to thoroughly review all the results before contacting the office for clarification of your results.  _______________________________________________________  If your blood pressure at your visit was 140/90 or greater, please contact your primary care physician to follow up on this.  _______________________________________________________  If you are age 45 or older, your body mass index should be between 23-30. Your Body mass index is 34.2 kg/m. If this is out of the aforementioned range listed, please consider follow up with your Primary Care Provider.  If you are age 28 or younger, your body mass index should be  between 19-25. Your Body mass index is 34.2 kg/m. If this is out of the aformentioned range listed, please consider follow up with your Primary Care Provider.   ________________________________________________________  The  GI providers would like to encourage you to use [facility] to communicate with providers for non-urgent requests or questions.  Due to long hold times on the telephone, sending your provider a message by [facility] may be a faster and more efficient way to get a response.  Please allow 48 business hours for a response.  Please remember that this is for non-urgent requests.  _______________________________________________________

## 2023-04-23 NOTE — Progress Notes (Addendum)
 Chief Complaint: Generalized abdominal pain, rectal itching and family history of colon cancer  HPI:    Clarence Mcgee is a 48 year old Caucasian male with a past medical history as listed below including reflux, IBS, depression and anxiety, known to Dr. Willy Harvest previously (and seen by [facility]), who presents to clinic today with a main complaint of rectal itching as well as some generalized abdominal discomfort and a family history of colon cancer.    09/10/2017 office visit with Dr. Willy Harvest.  At that time discussed an EGD in 20 10/2009 with erosive esophagitis and a 3 cm hiatal hernia.  He is complaining of recurrent heartburn problems and changes in bowel habits with lower abdominal pain.  At that point refilled Generic Nexium  40 mg daily, told to work on weight loss.  Discussed IBS which had stabilized.    09/10/2021 CTAP without contrast for abdominal pain with no evidence of intestinal obstruction, lumbar spondylosis.    Today, the patient tells me that he ended up having a colonoscopy at [facility] because this is where his PCP sent him for screening.  He thinks it was good but he was told to repeat in 5 years.  We do not have this report.  Also describes a family history of colon cancer in his father diagnosed at the age of 64.    Main complaint today is of rectal itching.  Tells me this does not happen every day or all the time but it is enough that it bothers him.  It seems to be worse at night.  He often puts a "cream" back there, not exactly sure what it is, which sometimes helps.  Also sees occasional rectal bleeding, bright red blood in the toilet paper.  Bowel movements seem to be fairly normal.    Also discusses some generalized abdominal discomfort and gas, seems worse when he eats dairy products which she now tries to avoid.    GERD controlled on Nexium  20 mg daily.    Denies fever, chills or weight loss.  Past Medical History:  Diagnosis Date   Anxiety    Depression    GERD  (gastroesophageal reflux disease)    Hernia    IBS (irritable bowel syndrome)    Low back pain    Psoriasis    Tic    Tobacco use     Past Surgical History:  Procedure Laterality Date   ESOPHAGOGASTRODUODENOSCOPY  2010   EYE SURGERY Left    ruptured globe    Current Outpatient Medications  Medication Sig Dispense Refill   esomeprazole  (NEXIUM ) 20 MG capsule Take 20 mg by mouth daily at 12 noon.     atorvastatin (LIPITOR) 10 MG tablet Take 10 mg by mouth daily. (Patient not taking: Reported on 04/23/2023)     No current facility-administered medications for this visit.    Allergies as of 04/23/2023 - Review Complete 04/23/2023  Allergen Reaction Noted   Moxifloxacin Rash 03/26/2015    Family History  Problem Relation Age of Onset   Diabetes Mother    Fibromyalgia Mother    Kidney disease Mother    Kidney cancer Mother    Diabetes Father    Colon cancer Father    Lupus Sister    Stroke Maternal Grandmother    Heart attack Maternal Grandfather     Social History   Socioeconomic History   Marital status: Divorced    Spouse name: Not on file   Number of children: 2   Years of  education: some college   Highest education level: Not on file  Occupational History   Occupation: Location manager    Comment: [employer]  Tobacco Use   Smoking status: Former    Current packs/day: 0.50    Average packs/day: 0.5 packs/day for 10.0 years (5.0 ttl pk-yrs)    Types: Cigarettes   Smokeless tobacco: Never  Substance and Sexual Activity   Alcohol use: Yes    Comment: social drinker   Drug use: No   Sexual activity: Not on file  Other Topics Concern   Not on file  Social History Narrative   Patient lives at home with his brother. 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   Divorced   Current smoker   Caffeine use: 2-3 cups daily   Right handed   2 sons adults as of 2019   Social Drivers of Manufacturing engineer Strain: Not on file  Food Insecurity: Not on file  Transportation Needs: Not on file  Physical Activity: Not on file  Stress: Not on file  Social Connections: Not on file  Intimate Partner Violence: Not on file    Review of Systems:    Constitutional: No weight loss, fever or chills Cardiovascular: No chest pain  Respiratory: No SOB  Gastrointestinal: See HPI and otherwise negative Genitourinary: No dysuria or change in urinary frequency Neurological: No headache, dizziness or syncope Musculoskeletal: No new muscle or joint pain Hematologic: No bruising Psychiatric: No history of depression or anxiety    Physical Exam:  Vital signs: BP 136/78   Pulse 78   Ht 5' 7.75" (1.721 m)   Wt 223 lb 4 oz (101.3 kg)   SpO2 97%   BMI 34.20 kg/m   Constitutional:   Pleasant obese Caucasian male appears to be in NAD, Well developed, Well nourished, alert and cooperative Head:  Normocephalic and atraumatic. Eyes:   PEERL, EOMI. No icterus. Conjunctiva pink. Ears:  Normal auditory acuity. Neck:  Supple Throat: Oral cavity and pharynx without inflammation, swelling or lesion.  Respiratory: Respirations even and unlabored. Lungs clear to auscultation bilaterally.   No wheezes, crackles, or rhonchi.  Cardiovascular: Normal S1, S2. No MRG. Regular rate and rhythm. No peripheral edema, cyanosis or pallor.  Gastrointestinal:  Soft, nondistended, nontender. No rebound or guarding. Normal bowel sounds. No appreciable masses or hepatomegaly. Rectal: External: Visible excoriations, changes in the rectal tissue, slightly thickened and wider in appearance around the rectum, no external hemorrhoids, no fissure; internal: No mass; and anoscopy: Grade 1 hemorrhoids, no signs of bleeding Msk:  Symmetrical without gross deformities. Without edema, no deformity or joint abnormality.  Neurologic:  Alert and  oriented x4;  grossly normal neurologically.  Skin:   Dry and intact without  significant lesions or rashes. Psychiatric: Demonstrates good judgement and reason without abnormal affect or behaviors.  Recent labs urgent care?  Assessment: 1.  Rectal itching: For the past few months, has been putting a cream [place] which sometimes helps, some days it does not bother him at all, rectal exam with no visible hemorrhoids, some excoriations and changes in rectal tissue; consider relation to known psoriasis +/- moisture in this area 2.  Screening for colon cancer: Last colonoscopy at the age of 79, due again in November?  Not sure when the last one exactly was, requesting records 3.  Family history of colon cancer: In his father diagnosed at the age  of 64 4.  Rectal bleeding: Bright red blood on the toilet paper after wiping 5.  Abdominal pain: Worse with dairy products; likely lactose intolerant  Plan: 1.  Request recent labs and last colonoscopy done at [facility] 2.  Scheduled patient for a diagnostic colonoscopy in the [facility] given a family history of colon cancer and rectal bleeding/irritation with some generalized abdominal pain.  This was scheduled with Dr. Willy Harvest.  Did provide the patient a detailed list of risks for procedure and he agrees to proceed. 3.  Continue Nexium  20 mg daily which controls reflux 4.  Discussed using A&D ointment around the rectum as a barrier ointment after drying in the morning and then again in the evening it looks like this is just skin irritation which is causing itching 5.  Avoid dairy products which seem to cause increase gas and bloating with some generalized abdominal pain 6.  Patient to follow in clinic per recommendations after time of procedure.  Reginal Capra, PA-C [facility] 04/23/2023, 9:01 AM   Addendum: 05/27/2023 9:34 AM  04/29/19 colonoscopy for screening with the family history of colon cancer in his father at 38.  One 4 mm polyp in the transverse colon, one 5 mm polyp in the sigmoid colon and otherwise normal.   Pathology showed polypoid colonic mucosa from the transverse colon and hyperplastic polyp from the sigmoid colon.  Repeat recommended in 5 years given family history.  Ensured patient is in recall 04/28/24.  Reginal Capra, PA-C

## 2023-05-20 ENCOUNTER — Encounter: Payer: Self-pay | Admitting: Internal Medicine

## 2023-05-27 ENCOUNTER — Ambulatory Visit: Admitting: Internal Medicine

## 2023-05-27 ENCOUNTER — Encounter: Payer: Self-pay | Admitting: Internal Medicine

## 2023-05-27 VITALS — BP 115/69 | HR 65 | Temp 98.1°F | Resp 13 | Ht 67.0 in | Wt 223.0 lb

## 2023-05-27 DIAGNOSIS — K5730 Diverticulosis of large intestine without perforation or abscess without bleeding: Secondary | ICD-10-CM | POA: Diagnosis not present

## 2023-05-27 DIAGNOSIS — K648 Other hemorrhoids: Secondary | ICD-10-CM | POA: Diagnosis not present

## 2023-05-27 DIAGNOSIS — Z800 Family history of malignant neoplasm of digestive organs: Secondary | ICD-10-CM | POA: Diagnosis not present

## 2023-05-27 DIAGNOSIS — D123 Benign neoplasm of transverse colon: Secondary | ICD-10-CM

## 2023-05-27 DIAGNOSIS — K625 Hemorrhage of anus and rectum: Secondary | ICD-10-CM

## 2023-05-27 DIAGNOSIS — R109 Unspecified abdominal pain: Secondary | ICD-10-CM

## 2023-05-27 MED ORDER — SODIUM CHLORIDE 0.9 % IV SOLN: 500.0000 mL | Freq: Once | INTRAVENOUS | Status: DC

## 2023-05-27 NOTE — Progress Notes (Signed)
 Pt with sleep apnea.  Placed #9 oral airway

## 2023-05-27 NOTE — Progress Notes (Signed)
 Called to room to assist during endoscopic procedure.  Patient ID and intended procedure confirmed with present staff. Received instructions for my participation in the procedure from the performing physician.

## 2023-05-27 NOTE — Progress Notes (Signed)
 History and Physical Interval Note:  05/27/2023 9:41 AM  Clarence Mcgee  has presented today for endoscopic procedure(s), with the diagnosis of  Encounter Diagnoses  Name Primary?   Rectal bleeding Yes   Family history of colon cancer    Abdominal pain, unspecified abdominal location   .  The various methods of evaluation and treatment have been discussed with the patient and/or family. After consideration of risks, benefits and other options for treatment, the patient has consented to  the endoscopic procedure(s).   The patient's history has been reviewed, patient examined, no change in status, stable for endoscopic procedure(s).  I have reviewed the patient's chart and labs.  Questions were answered to the patient's satisfaction.     Kenney Peacemaker, MD, [facility]

## 2023-05-27 NOTE — Op Note (Signed)
 [facility] Patient Name: Clarence Mcgee Procedure Date: 05/27/2023 9:36 AM MRN: [id number] Endoscopist: Kenney Peacemaker , MD, [id number] Age: 48 Referring MD:  Date of Birth: 1975/07/02 Gender: Male Account #: [id number] Procedure:                Colonoscopy Indications:              Rectal bleeding Medicines:                Monitored Anesthesia Care Procedure:                Pre-Anesthesia Assessment:                           - Prior to the procedure, a History and Physical                            was performed, and patient medications and                            allergies were reviewed. The patient's tolerance of                            previous anesthesia was also reviewed. The risks                            and benefits of the procedure and the sedation                            options and risks were discussed with the patient.                            All questions were answered, and informed consent                            was obtained. Prior Anticoagulants: The patient has                            taken no anticoagulant or antiplatelet agents. ASA                            Grade Assessment: II - A patient with mild systemic                            disease. After reviewing the risks and benefits,                            the patient was deemed in satisfactory condition to                            undergo the procedure.                           After obtaining informed consent, the colonoscope  was passed under direct vision. Throughout the                            procedure, the patient's blood pressure, pulse, and                            oxygen saturations were monitored continuously. The                            CF [id number] #[id number] was introduced through the anus                            and advanced to the the cecum, identified by                            appendiceal orifice and ileocecal valve. The                             colonoscopy was performed without difficulty. The                            patient tolerated the procedure well. The quality                            of the bowel preparation was good. The ileocecal                            valve, appendiceal orifice, and rectum were                            photographed. The bowel preparation used was SUPREP                            via split dose instruction. Scope In: 9:52:53 AM Scope Out: 10:08:09 AM Scope Withdrawal Time: 0 hours 11 minutes 44 seconds  Total Procedure Duration: 0 hours 15 minutes 16 seconds  Findings:                 The perianal and digital rectal examinations were                            normal. Pertinent negatives include normal prostate                            (size, shape, and consistency).                           Three sessile polyps were found in the transverse                            colon. The polyps were diminutive in size. These                            polyps were removed with a cold snare. Resection  and retrieval were complete. Verification of                            patient identification for the specimen was done.                            Estimated blood loss was minimal.                           Multiple diverticula were found in the sigmoid                            colon.                           Internal hemorrhoids were found.                           The exam was otherwise without abnormality on                            direct and retroflexion views. Complications:            No immediate complications. Estimated Blood Loss:     Estimated blood loss was minimal. Impression:               - Three diminutive polyps in the transverse colon,                            removed with a cold snare. Resected and retrieved.                           - Diverticulosis in the sigmoid colon.                           - Internal hemorrhoids.                            - The examination was otherwise normal on direct                            and retroflexion views. Also has family hx CRCA in                            father dx at age 52 Recommendation:           - Patient has a contact number available for                            emergencies. The signs and symptoms of potential                            delayed complications were discussed with the                            patient. Return to normal activities tomorrow.  Written discharge instructions were provided to the                            patient.                           - Resume previous diet.                           - Continue present medications.                           - Repeat colonoscopy is recommended. The                            colonoscopy date will be determined after pathology                            results from today's exam become available for                            review. Kenney Peacemaker, MD 05/27/2023 10:19:24 AM This report has been signed electronically.

## 2023-05-27 NOTE — Progress Notes (Signed)
 Pt's states no medical or surgical changes since previsit or office visit.

## 2023-05-27 NOTE — Progress Notes (Signed)
 Vss nad trans to pacu

## 2023-05-27 NOTE — Patient Instructions (Addendum)
 I found and removed 3 tiny polyps. I will let you know pathology results and when to have another routine colonoscopy by mail and/or My Chart.  I also saw hemorrhoids and diverticulosis. Please read the hanbdouts.  I will let you know pathology results and when to have another routine colonoscopy by mail and/or My Chart.  I appreciate the opportunity to care for you. Kenney Peacemaker, MD, [facility]   Discharge instructions given. Handouts on polyps,Diverticulosis and Hemorrhoids. Resume previous medications. YOU HAD AN ENDOSCOPIC PROCEDURE TODAY AT THE [facility]:   Refer to the procedure report that was given to you for any specific questions about what was found during the examination.  If the procedure report does not answer your questions, please call your gastroenterologist to clarify.  If you requested that your care partner not be given the details of your procedure findings, then the procedure report has been included in a sealed envelope for you to review at your convenience later.  YOU SHOULD EXPECT: Some feelings of bloating in the abdomen. Passage of more gas than usual.  Walking can help get rid of the air that was put into your GI tract during the procedure and reduce the bloating. If you had a lower endoscopy (such as a colonoscopy or flexible sigmoidoscopy) you may notice spotting of blood in your stool or on the toilet paper. If you underwent a bowel prep for your procedure, you may not have a normal bowel movement for a few days.  Please Note:  You might notice some irritation and congestion in your nose or some drainage.  This is from the oxygen used during your procedure.  There is no need for concern and it should clear up in a day or so.  SYMPTOMS TO REPORT IMMEDIATELY:  Following lower endoscopy (colonoscopy or flexible sigmoidoscopy):  Excessive amounts of blood in the stool  Significant tenderness or worsening of abdominal pains  Swelling of the abdomen that  is new, acute  Fever of 100F or higher   For urgent or emergent issues, a gastroenterologist can be reached at any hour by calling [phone]. Do not use MyChart messaging for urgent concerns.    DIET:  We do recommend a small meal at first, but then you may proceed to your regular diet.  Drink plenty of fluids but you should avoid alcoholic beverages for 24 hours.  ACTIVITY:  You should plan to take it easy for the rest of today and you should NOT DRIVE or use heavy machinery until tomorrow (because of the sedation medicines used during the test).    FOLLOW UP: Our staff will call the number listed on your records the next business day following your procedure.  We will call around 7:15- 8:00 am to check on you and address any questions or concerns that you may have regarding the information given to you following your procedure. If we do not reach you, we will leave a message.     If any biopsies were taken you will be contacted by phone or by letter within the next 1-3 weeks.  Please call us  at [phone] if you have not heard about the biopsies in 3 weeks.    SIGNATURES/CONFIDENTIALITY: You and/or your care partner have signed paperwork which will be entered into your electronic medical record.  These signatures attest to the fact that that the information above on your After Visit Summary has been reviewed and is understood.  Full responsibility of the confidentiality  of this discharge information lies with you and/or your care-partner.

## 2023-05-28 ENCOUNTER — Telehealth: Payer: Self-pay

## 2023-05-28 NOTE — Telephone Encounter (Signed)
  Follow up Call-     05/27/2023    8:58 AM  Call back number  Post procedure Call Back phone  # [phone]  Permission to leave phone message Yes     Patient questions:  Do you have a fever, pain , or abdominal swelling? No. Pain Score  0 *  Have you tolerated food without any problems? Yes.    Have you been able to return to your normal activities? Yes.    Do you have any questions about your discharge instructions: Diet   No. Medications  No. Follow up visit  No.  Do you have questions or concerns about your Care? No.  Actions: * If pain score is 4 or above: No action needed, pain <4.

## 2023-05-29 ENCOUNTER — Encounter: Payer: Self-pay | Admitting: Internal Medicine

## 2023-05-29 DIAGNOSIS — Z860101 Personal history of adenomatous and serrated colon polyps: Secondary | ICD-10-CM | POA: Insufficient documentation

## 2023-05-29 HISTORY — DX: Personal history of adenomatous and serrated colon polyps: Z86.0101

## 2023-05-29 LAB — SURGICAL PATHOLOGY
